# Patient Record
Sex: Male | Born: 1940
Health system: Southern US, Community
[De-identification: ages and names within clinical notes are randomized; demographics above are authoritative.]

## PROBLEM LIST (undated history)

## (undated) DIAGNOSIS — Q211 Atrial septal defect: Secondary | ICD-10-CM

## (undated) DIAGNOSIS — E7800 Pure hypercholesterolemia, unspecified: Secondary | ICD-10-CM

## (undated) DIAGNOSIS — N529 Male erectile dysfunction, unspecified: Secondary | ICD-10-CM

## (undated) DIAGNOSIS — F4321 Adjustment disorder with depressed mood: Secondary | ICD-10-CM

## (undated) DIAGNOSIS — R42 Dizziness and giddiness: Secondary | ICD-10-CM

## (undated) DIAGNOSIS — I420 Dilated cardiomyopathy: Secondary | ICD-10-CM

## (undated) DIAGNOSIS — F4320 Adjustment disorder, unspecified: Secondary | ICD-10-CM

## (undated) DIAGNOSIS — C4491 Basal cell carcinoma of skin, unspecified: Secondary | ICD-10-CM

## (undated) DIAGNOSIS — Q2112 Patent foramen ovale: Secondary | ICD-10-CM

## (undated) DIAGNOSIS — I2510 Atherosclerotic heart disease of native coronary artery without angina pectoris: Secondary | ICD-10-CM

## (undated) DIAGNOSIS — I219 Acute myocardial infarction, unspecified: Secondary | ICD-10-CM

## (undated) DIAGNOSIS — N401 Enlarged prostate with lower urinary tract symptoms: Secondary | ICD-10-CM

## (undated) DIAGNOSIS — I10 Essential (primary) hypertension: Secondary | ICD-10-CM

## (undated) DIAGNOSIS — I739 Peripheral vascular disease, unspecified: Secondary | ICD-10-CM

## (undated) DIAGNOSIS — R55 Syncope and collapse: Secondary | ICD-10-CM

## (undated) HISTORY — DX: Essential (primary) hypertension: I10

## (undated) HISTORY — DX: Atherosclerotic heart disease of native coronary artery without angina pectoris: I25.10

## (undated) HISTORY — DX: Pure hypercholesterolemia, unspecified: E78.00

## (undated) HISTORY — DX: Peripheral vascular disease, unspecified: I73.9

## (undated) HISTORY — DX: Acute myocardial infarction, unspecified: I21.9

## (undated) HISTORY — PX: NASAL SEPTUM SURGERY: SHX37

## (undated) HISTORY — DX: Basal cell carcinoma of skin, unspecified: C44.91

## (undated) HISTORY — PX: THROMBECTOMY: PRO61

## (undated) HISTORY — DX: Male erectile dysfunction, unspecified: N52.9

## (undated) HISTORY — DX: Dizziness and giddiness: R42

## (undated) HISTORY — DX: Benign prostatic hyperplasia with lower urinary tract symptoms: N40.1

## (undated) HISTORY — PX: ABDOMINAL AORTIC ANEURYSM REPAIR: SHX42

## (undated) HISTORY — DX: Syncope and collapse: R55

## (undated) HISTORY — DX: Adjustment disorder, unspecified: F43.20

## (undated) HISTORY — PX: TONSILLECTOMY: SUR1361

## (undated) HISTORY — DX: Adjustment disorder with depressed mood: F43.21

## (undated) HISTORY — PX: MULTIPLE TOOTH EXTRACTIONS: SHX2053

---
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
## 1999-02-08 ENCOUNTER — Ambulatory Visit: Admission: RE | Admit: 1999-02-08 | Discharge: 1999-02-08 | Payer: Self-pay | Admitting: Family Medicine

## 1999-02-14 ENCOUNTER — Encounter: Payer: Self-pay | Admitting: Cardiology

## 1999-02-14 ENCOUNTER — Ambulatory Visit (HOSPITAL_COMMUNITY): Admission: RE | Admit: 1999-02-14 | Discharge: 1999-02-14 | Payer: Self-pay | Admitting: Cardiology

## 1999-04-17 HISTORY — PX: KNEE ARTHROSCOPY: SUR90

## 1999-04-17 HISTORY — PX: CORONARY ARTERY BYPASS GRAFT: SHX141

## 1999-06-28 ENCOUNTER — Encounter: Payer: Self-pay | Admitting: Vascular Surgery

## 1999-06-30 ENCOUNTER — Inpatient Hospital Stay: Admission: RE | Admit: 1999-06-30 | Discharge: 1999-07-02 | Payer: Self-pay | Admitting: Vascular Surgery

## 1999-06-30 ENCOUNTER — Encounter: Payer: Self-pay | Admitting: Vascular Surgery

## 1999-10-30 ENCOUNTER — Ambulatory Visit: Admission: RE | Admit: 1999-10-30 | Discharge: 1999-10-30 | Payer: Self-pay | Admitting: Vascular Surgery

## 1999-11-03 ENCOUNTER — Encounter: Payer: Self-pay | Admitting: Vascular Surgery

## 1999-11-03 ENCOUNTER — Inpatient Hospital Stay (HOSPITAL_COMMUNITY): Admission: AD | Admit: 1999-11-03 | Discharge: 1999-11-19 | Payer: Self-pay | Admitting: Vascular Surgery

## 1999-11-04 ENCOUNTER — Encounter: Payer: Self-pay | Admitting: *Deleted

## 1999-11-04 ENCOUNTER — Encounter: Payer: Self-pay | Admitting: Vascular Surgery

## 1999-11-05 ENCOUNTER — Encounter: Payer: Self-pay | Admitting: *Deleted

## 1999-11-06 ENCOUNTER — Encounter: Payer: Self-pay | Admitting: Vascular Surgery

## 1999-11-07 ENCOUNTER — Encounter: Payer: Self-pay | Admitting: Vascular Surgery

## 1999-11-08 ENCOUNTER — Encounter: Payer: Self-pay | Admitting: Vascular Surgery

## 1999-11-09 ENCOUNTER — Encounter: Payer: Self-pay | Admitting: Vascular Surgery

## 1999-11-10 ENCOUNTER — Encounter: Payer: Self-pay | Admitting: Vascular Surgery

## 1999-11-13 ENCOUNTER — Encounter: Payer: Self-pay | Admitting: Thoracic Surgery (Cardiothoracic Vascular Surgery)

## 1999-11-14 ENCOUNTER — Encounter: Payer: Self-pay | Admitting: Thoracic Surgery (Cardiothoracic Vascular Surgery)

## 1999-11-15 ENCOUNTER — Encounter: Payer: Self-pay | Admitting: Thoracic Surgery (Cardiothoracic Vascular Surgery)

## 1999-11-16 ENCOUNTER — Encounter: Payer: Self-pay | Admitting: Thoracic Surgery (Cardiothoracic Vascular Surgery)

## 1999-11-21 ENCOUNTER — Ambulatory Visit (HOSPITAL_COMMUNITY): Admission: RE | Admit: 1999-11-21 | Discharge: 1999-11-21 | Payer: Self-pay | Admitting: *Deleted

## 2000-12-27 ENCOUNTER — Emergency Department (HOSPITAL_COMMUNITY): Admission: EM | Admit: 2000-12-27 | Discharge: 2000-12-27 | Payer: Self-pay | Admitting: Emergency Medicine

## 2002-10-29 ENCOUNTER — Emergency Department (HOSPITAL_COMMUNITY): Admission: EM | Admit: 2002-10-29 | Discharge: 2002-10-29 | Payer: Self-pay | Admitting: Emergency Medicine

## 2006-05-17 ENCOUNTER — Ambulatory Visit (HOSPITAL_COMMUNITY): Admission: RE | Admit: 2006-05-17 | Discharge: 2006-05-17 | Payer: Self-pay | Admitting: Otolaryngology

## 2010-04-16 HISTORY — PX: DUPUYTREN CONTRACTURE RELEASE: SHX1478

## 2010-07-07 ENCOUNTER — Encounter: Payer: Self-pay | Admitting: Cardiovascular Disease

## 2010-07-18 ENCOUNTER — Encounter: Payer: Self-pay | Admitting: *Deleted

## 2010-07-18 ENCOUNTER — Encounter: Payer: Self-pay | Admitting: Cardiovascular Disease

## 2010-07-19 ENCOUNTER — Encounter: Payer: Self-pay | Admitting: Cardiovascular Disease

## 2010-07-19 ENCOUNTER — Ambulatory Visit (INDEPENDENT_AMBULATORY_CARE_PROVIDER_SITE_OTHER): Payer: Medicare Other | Admitting: Cardiovascular Disease

## 2010-07-19 DIAGNOSIS — I2510 Atherosclerotic heart disease of native coronary artery without angina pectoris: Secondary | ICD-10-CM

## 2010-07-19 DIAGNOSIS — I714 Abdominal aortic aneurysm, without rupture: Secondary | ICD-10-CM

## 2010-07-19 DIAGNOSIS — I739 Peripheral vascular disease, unspecified: Secondary | ICD-10-CM

## 2010-07-19 DIAGNOSIS — Z01818 Encounter for other preprocedural examination: Secondary | ICD-10-CM

## 2010-07-19 NOTE — Progress Notes (Signed)
70 yo patient previously seen by Dr Amil Amen and [facility] cardiology.  Referred by Durwin Nora for preoperative clearance.  Extensive history of vascular disease.  AAA repair with LLE femoral bypass 2001 by Dr Hart [place].  Complicated  By M.I. And pulmonary edema.  Left brachial cath by Dr Ty Hilts showed LM and 3VD mildly decreased EF.  CAGB Dr Cornelius Moras LIMA LAD, SVG sequential IM and OM SVG PDA.  Has not had close F/U since then.  Stopped smoking at that time.  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  Low risk procedure.  Active around house and walks a lot.  No angina, claudication, dyspnea, palpitations edema or syncope.  Given lack of F/U and 70 yo grafts it is reasonable to do stress myovue to clear for surgery, assess exercise tolerance and claudication, and estimate EF and RWMA;s.    ROS: Denies fever, malais, weight loss, blurry vision, decreased visual acuity, cough, sputum, SOB, hemoptysis, pleuritic pain, palpitaitons, heartburn, abdominal pain, melena, lower extremity edema, claudication, or rash.   General: Affect appropriate Chronically ill appearing HEENT: normal Neck supple with no adenopathy JVP normal no bruits no thyromegaly Lungs clear with no wheezing and good diaphragmatic motion Heart:  S1/S2 no murmur,rub, gallop or click PMI normal Abdomen: benighn, BS positve, no tenderness, S/P  AAA repari Bilateral femoral  bruit.  No HSM or HJR Distal pulses intact  But diminished with LLE bypass No edema Neuro non-focal Skin warm and dry No muscular weakness Bilateral dupytrens contracture in hands  Medications No current outpatient prescriptions on file.    Allergies Review of patient's allergies indicates no known allergies.  Family History: Family History  Problem Relation Age of Onset  . Coronary artery disease    . Lung cancer      Social History: History   Social History  . Marital Status: Married    Spouse Name: N/A    Number of  Children: N/A  . Years of Education: N/A   Occupational History  . Not on file.   Social History Main Topics  . Smoking status: Former Games developer  . Smokeless tobacco: Not on file  . Alcohol Use: Not on file  . Drug Use: Not on file  . Sexually Active: Not on file   Other Topics Concern  . Not on file   Social History Narrative  . No narrative on file    Electrocardiogram: Will review from [facility] when available and will have ECG;s for myovue  Assessment and Plan

## 2010-07-19 NOTE — Patient Instructions (Addendum)
Your physician has requested that you have en exercise stress myoview. For further information please visit [email or web address]. Please follow instruction sheet, as given.  Your physician recommends that you schedule a follow-up appointment in: 6 months

## 2010-07-20 ENCOUNTER — Ambulatory Visit (HOSPITAL_COMMUNITY): Payer: Medicare Other | Attending: Cardiovascular Disease | Admitting: Radiology

## 2010-07-20 DIAGNOSIS — Z01818 Encounter for other preprocedural examination: Secondary | ICD-10-CM

## 2010-07-20 DIAGNOSIS — I739 Peripheral vascular disease, unspecified: Secondary | ICD-10-CM

## 2010-07-20 DIAGNOSIS — I472 Ventricular tachycardia: Secondary | ICD-10-CM

## 2010-07-20 DIAGNOSIS — Z01810 Encounter for preprocedural cardiovascular examination: Secondary | ICD-10-CM | POA: Insufficient documentation

## 2010-07-20 DIAGNOSIS — I2510 Atherosclerotic heart disease of native coronary artery without angina pectoris: Secondary | ICD-10-CM

## 2010-07-20 MED ORDER — TECHNETIUM TC 99M TETROFOSMIN IV KIT
11.0000 | PACK | Freq: Once | INTRAVENOUS | Status: AC | PRN
  Administered 2010-07-20: 11 via INTRAVENOUS

## 2010-07-20 MED ORDER — REGADENOSON 0.4 MG/5ML IV SOLN
0.4000 mg | Freq: Once | INTRAVENOUS | Status: AC
  Administered 2010-07-20: 0.4 mg via INTRAVENOUS

## 2010-07-20 MED ORDER — TECHNETIUM TC 99M TETROFOSMIN IV KIT
33.0000 | PACK | Freq: Once | INTRAVENOUS | Status: AC | PRN
  Administered 2010-07-20: 33 via INTRAVENOUS

## 2010-07-20 NOTE — Progress Notes (Signed)
[facility] SITE 3 NUCLEAR MED [address] [place] [address] [phone]  Cardiology Nuclear Med Frank Arias is a 70 y.o. male [id number] 1941/01/14   Nuclear Med Background Indication for Stress Test:  Evaluation for Ischemia, Graft Patency and Pending Hand Surgery by Dr. Dairl Ponder  History:  '01 Non Q MI>CABG/AAA repair with LLE fem-bypass Cardiac Risk Factors: Family History - CAD and History of Smoking  Symptoms:  No cardiac complaints   Nuclear Pre-Procedure Caffeine/Decaff Intake:  None NPO After: 8:00pm   Lungs:  Diminished, but clear.  O2 sat 95% on RA. IV 0.9% NS with Angio Cath:  20g  IV Site: R Forearm  IV Started by:  Stanton Kidney, EMT-P  Chest Size (in):  42 Cup Size: n/a  Height: 5\' 7"  (1.702 m)  Weight:  175 lb (79.379 kg)  BMI:  Body mass index is 27.41 kg/(m^2). Tech Comments:  NA    Nuclear Med Study 1 or 2 day study: 1 day  Stress Test Type:  Treadmill/Lexiscan  Reading MD: Charlton Haws, MD  Order Authorizing Provider:  Charlton Haws, MD  Resting Radionuclide: Technetium 9m Tetrofosmin  Resting Radionuclide Dose: 11 mCi   Stress Radionuclide:  Technetium 87m Tetrofosmin  Stress Radionuclide Dose: 33 mCi           Stress Protocol Rest HR: 67 Stress HR: 129  Rest BP: 156/97 Stress BP: 216/100  Exercise Time (min): 4:16 METS: n/a   Predicted Max HR: 150 bpm % Max HR: 86 bpm Rate Pressure Product: 29528   Dose of Adenosine (mg):  n/a Dose of Lexiscan: 0.4 mg  Dose of Atropine (mg): n/a Dose of Dobutamine: n/a mcg/kg/min (at max HR)  Stress Test Technologist: [employer], CMA-N  Nuclear Technologist:  Domenic Polite, CNMT     Rest Procedure:  Myocardial perfusion imaging was performed at rest 45 minutes following the intravenous administration of Technetium 66m Tetrofosmin. Rest ECG: Nonspecific T-wave changes, occasional PVC's and one 3-beat run of V-tach  Stress Procedure:  The patient initially walked  the treadmill utilizing the Bruce protocol, but was unable to obtain his heart rate due to claudication and a hypertensive response, 216/100.  He was then given IV Lexiscan 0.4 mg over 15-seconds with concurrent low level exercise and then Technetium 6m Tetrofosmin was injected at 30-seconds while the patient continued walking one more minute.  There were no diagnostic changes with Lexiscan.  There were frequent PVC's and occasional runs of nonsustained V-tach, with the longest being 5-beats.  Quantitative spect images were obtained after a 45-minute delay. Stress ECG: No significant change from baseline ECG  QPS Raw Data Images:  Normal; no motion artifact; normal heart/lung ratio. Stress Images:  There is decreased uptake in the inferior wall. Rest Images:  There is decreased uptake in the inferior wall. Subtraction (SDS):  There is a fixed inferior defect that is most consistent with diaphragmatic attenuation. Transient Ischemic Dilatation (Normal <1.22):  0.95 Lung/Heart Ratio (Normal <0.45):  0.30  Quantitative Gated Spect Images QGS EDV:  149 ml QGS ESV:  99 ml QGS cine images:  Decreased LV function with inferior wall motion QGS EF: 34%  Impression Exercise Capacity:  Lexiscan with low level exercise. BP Response:  Normal blood pressure response. Clinical Symptoms:  No chest pain. ECG Impression:  No significant ST segment change suggestive of ischemia. Comparison with Prior Nuclear Study: No images to compare  Overall Impression:  Moderate inferior wall infarct from apex  to base with no ischemia.  EF 34% Given low risk surgery and no ischemia the patient is cleared to have hand surgery but needs F/U for better medical Rx       Charlton Haws

## 2010-07-24 ENCOUNTER — Encounter (HOSPITAL_COMMUNITY): Payer: Self-pay | Admitting: Cardiovascular Disease

## 2010-07-25 ENCOUNTER — Telehealth: Payer: Self-pay | Admitting: Cardiovascular Disease

## 2010-07-25 NOTE — Telephone Encounter (Signed)
Spoke with pt, myoview results faxed to dr [employer]ne[employer] office. Pt aware. Frank Arias

## 2010-08-08 ENCOUNTER — Telehealth: Payer: Self-pay | Admitting: Cardiovascular Disease

## 2010-08-08 NOTE — Telephone Encounter (Signed)
All Cardiac faxed to [facility] @ [phone] 08/08/10/KM

## 2012-05-20 ENCOUNTER — Other Ambulatory Visit: Payer: Self-pay | Admitting: Family Medicine

## 2012-05-20 DIAGNOSIS — M25511 Pain in right shoulder: Secondary | ICD-10-CM

## 2012-05-21 ENCOUNTER — Ambulatory Visit
Admission: RE | Admit: 2012-05-21 | Discharge: 2012-05-21 | Disposition: A | Discharge status: Home / Self Care | Payer: Medicare Other | Source: Ambulatory Visit | Attending: Family Medicine | Admitting: Family Medicine

## 2012-05-21 DIAGNOSIS — M25511 Pain in right shoulder: Secondary | ICD-10-CM

## 2014-05-21 DIAGNOSIS — C44319 Basal cell carcinoma of skin of other parts of face: Secondary | ICD-10-CM | POA: Diagnosis not present

## 2014-05-21 DIAGNOSIS — D225 Melanocytic nevi of trunk: Secondary | ICD-10-CM | POA: Diagnosis not present

## 2014-05-21 DIAGNOSIS — C44310 Basal cell carcinoma of skin of unspecified parts of face: Secondary | ICD-10-CM | POA: Diagnosis not present

## 2014-05-21 DIAGNOSIS — C44229 Squamous cell carcinoma of skin of left ear and external auricular canal: Secondary | ICD-10-CM | POA: Diagnosis not present

## 2014-05-21 DIAGNOSIS — C44219 Basal cell carcinoma of skin of left ear and external auricular canal: Secondary | ICD-10-CM | POA: Diagnosis not present

## 2014-09-06 DIAGNOSIS — R42 Dizziness and giddiness: Secondary | ICD-10-CM | POA: Diagnosis not present

## 2014-09-06 DIAGNOSIS — N400 Enlarged prostate without lower urinary tract symptoms: Secondary | ICD-10-CM | POA: Diagnosis not present

## 2014-09-06 DIAGNOSIS — R350 Frequency of micturition: Secondary | ICD-10-CM | POA: Diagnosis not present

## 2014-09-06 DIAGNOSIS — J309 Allergic rhinitis, unspecified: Secondary | ICD-10-CM | POA: Diagnosis not present

## 2014-09-06 DIAGNOSIS — I2510 Atherosclerotic heart disease of native coronary artery without angina pectoris: Secondary | ICD-10-CM | POA: Diagnosis not present

## 2014-09-06 DIAGNOSIS — I739 Peripheral vascular disease, unspecified: Secondary | ICD-10-CM | POA: Diagnosis not present

## 2014-11-01 DIAGNOSIS — R351 Nocturia: Secondary | ICD-10-CM | POA: Diagnosis not present

## 2014-11-01 DIAGNOSIS — R350 Frequency of micturition: Secondary | ICD-10-CM | POA: Diagnosis not present

## 2015-02-07 DIAGNOSIS — Z961 Presence of intraocular lens: Secondary | ICD-10-CM | POA: Diagnosis not present

## 2015-02-07 DIAGNOSIS — H26493 Other secondary cataract, bilateral: Secondary | ICD-10-CM | POA: Diagnosis not present

## 2015-02-07 DIAGNOSIS — H43813 Vitreous degeneration, bilateral: Secondary | ICD-10-CM | POA: Diagnosis not present

## 2015-02-17 DIAGNOSIS — H26492 Other secondary cataract, left eye: Secondary | ICD-10-CM | POA: Diagnosis not present

## 2015-02-17 DIAGNOSIS — H2640 Unspecified secondary cataract: Secondary | ICD-10-CM | POA: Diagnosis not present

## 2015-03-17 DIAGNOSIS — H26491 Other secondary cataract, right eye: Secondary | ICD-10-CM | POA: Diagnosis not present

## 2015-03-17 DIAGNOSIS — H2640 Unspecified secondary cataract: Secondary | ICD-10-CM | POA: Diagnosis not present

## 2015-08-18 DIAGNOSIS — C44519 Basal cell carcinoma of skin of other part of trunk: Secondary | ICD-10-CM | POA: Diagnosis not present

## 2015-08-18 DIAGNOSIS — L821 Other seborrheic keratosis: Secondary | ICD-10-CM | POA: Diagnosis not present

## 2015-08-18 DIAGNOSIS — C44612 Basal cell carcinoma of skin of right upper limb, including shoulder: Secondary | ICD-10-CM | POA: Diagnosis not present

## 2015-08-18 DIAGNOSIS — C44319 Basal cell carcinoma of skin of other parts of face: Secondary | ICD-10-CM | POA: Diagnosis not present

## 2015-08-18 DIAGNOSIS — Z85828 Personal history of other malignant neoplasm of skin: Secondary | ICD-10-CM | POA: Diagnosis not present

## 2015-08-18 DIAGNOSIS — D485 Neoplasm of uncertain behavior of skin: Secondary | ICD-10-CM | POA: Diagnosis not present

## 2015-12-27 DIAGNOSIS — E782 Mixed hyperlipidemia: Secondary | ICD-10-CM | POA: Diagnosis not present

## 2015-12-27 DIAGNOSIS — N401 Enlarged prostate with lower urinary tract symptoms: Secondary | ICD-10-CM | POA: Diagnosis not present

## 2015-12-27 DIAGNOSIS — I2510 Atherosclerotic heart disease of native coronary artery without angina pectoris: Secondary | ICD-10-CM | POA: Diagnosis not present

## 2015-12-27 DIAGNOSIS — N529 Male erectile dysfunction, unspecified: Secondary | ICD-10-CM | POA: Diagnosis not present

## 2016-04-02 DIAGNOSIS — I2510 Atherosclerotic heart disease of native coronary artery without angina pectoris: Secondary | ICD-10-CM | POA: Diagnosis not present

## 2016-04-02 DIAGNOSIS — N529 Male erectile dysfunction, unspecified: Secondary | ICD-10-CM | POA: Diagnosis not present

## 2016-04-02 DIAGNOSIS — E782 Mixed hyperlipidemia: Secondary | ICD-10-CM | POA: Diagnosis not present

## 2016-04-02 DIAGNOSIS — N401 Enlarged prostate with lower urinary tract symptoms: Secondary | ICD-10-CM | POA: Diagnosis not present

## 2016-11-08 DIAGNOSIS — Z9889 Other specified postprocedural states: Secondary | ICD-10-CM | POA: Diagnosis not present

## 2016-11-08 DIAGNOSIS — I739 Peripheral vascular disease, unspecified: Secondary | ICD-10-CM | POA: Diagnosis not present

## 2016-11-08 DIAGNOSIS — N529 Male erectile dysfunction, unspecified: Secondary | ICD-10-CM | POA: Diagnosis not present

## 2016-11-08 DIAGNOSIS — I2510 Atherosclerotic heart disease of native coronary artery without angina pectoris: Secondary | ICD-10-CM | POA: Diagnosis not present

## 2016-11-08 DIAGNOSIS — Z951 Presence of aortocoronary bypass graft: Secondary | ICD-10-CM | POA: Diagnosis not present

## 2016-11-15 DIAGNOSIS — I739 Peripheral vascular disease, unspecified: Secondary | ICD-10-CM | POA: Diagnosis not present

## 2016-11-15 DIAGNOSIS — I10 Essential (primary) hypertension: Secondary | ICD-10-CM | POA: Diagnosis not present

## 2016-11-15 DIAGNOSIS — Z951 Presence of aortocoronary bypass graft: Secondary | ICD-10-CM | POA: Diagnosis not present

## 2016-11-15 DIAGNOSIS — I2510 Atherosclerotic heart disease of native coronary artery without angina pectoris: Secondary | ICD-10-CM | POA: Diagnosis not present

## 2016-11-16 DIAGNOSIS — I739 Peripheral vascular disease, unspecified: Secondary | ICD-10-CM | POA: Diagnosis not present

## 2016-11-16 DIAGNOSIS — I10 Essential (primary) hypertension: Secondary | ICD-10-CM | POA: Diagnosis not present

## 2016-11-16 DIAGNOSIS — I2510 Atherosclerotic heart disease of native coronary artery without angina pectoris: Secondary | ICD-10-CM | POA: Diagnosis not present

## 2016-11-21 DIAGNOSIS — I739 Peripheral vascular disease, unspecified: Secondary | ICD-10-CM | POA: Diagnosis not present

## 2016-11-21 DIAGNOSIS — Z951 Presence of aortocoronary bypass graft: Secondary | ICD-10-CM | POA: Diagnosis not present

## 2016-11-30 DIAGNOSIS — I739 Peripheral vascular disease, unspecified: Secondary | ICD-10-CM | POA: Diagnosis not present

## 2016-11-30 DIAGNOSIS — I2510 Atherosclerotic heart disease of native coronary artery without angina pectoris: Secondary | ICD-10-CM | POA: Diagnosis not present

## 2016-12-06 DIAGNOSIS — I10 Essential (primary) hypertension: Secondary | ICD-10-CM | POA: Diagnosis not present

## 2016-12-06 DIAGNOSIS — Z87891 Personal history of nicotine dependence: Secondary | ICD-10-CM | POA: Diagnosis not present

## 2016-12-07 ENCOUNTER — Encounter (HOSPITAL_COMMUNITY)
Admission: RE | Disposition: A | Discharge status: Home / Self Care | Payer: Self-pay | Source: Ambulatory Visit | Attending: Cardiology

## 2016-12-07 ENCOUNTER — Ambulatory Visit (HOSPITAL_COMMUNITY)
Admission: RE | Admit: 2016-12-07 | Discharge: 2016-12-07 | Disposition: A | Discharge status: Home / Self Care | Payer: Medicare HMO | Source: Ambulatory Visit | Attending: Cardiology | Admitting: Cardiology

## 2016-12-07 ENCOUNTER — Encounter (HOSPITAL_COMMUNITY): Payer: Self-pay

## 2016-12-07 DIAGNOSIS — Z87891 Personal history of nicotine dependence: Secondary | ICD-10-CM | POA: Insufficient documentation

## 2016-12-07 DIAGNOSIS — I10 Essential (primary) hypertension: Secondary | ICD-10-CM | POA: Insufficient documentation

## 2016-12-07 DIAGNOSIS — I252 Old myocardial infarction: Secondary | ICD-10-CM | POA: Insufficient documentation

## 2016-12-07 DIAGNOSIS — Z951 Presence of aortocoronary bypass graft: Secondary | ICD-10-CM | POA: Diagnosis not present

## 2016-12-07 DIAGNOSIS — I2510 Atherosclerotic heart disease of native coronary artery without angina pectoris: Secondary | ICD-10-CM | POA: Diagnosis not present

## 2016-12-07 DIAGNOSIS — I739 Peripheral vascular disease, unspecified: Secondary | ICD-10-CM | POA: Diagnosis present

## 2016-12-07 DIAGNOSIS — I70213 Atherosclerosis of native arteries of extremities with intermittent claudication, bilateral legs: Secondary | ICD-10-CM | POA: Diagnosis not present

## 2016-12-07 HISTORY — PX: LOWER EXTREMITY ANGIOGRAPHY: CATH118251

## 2016-12-07 SURGERY — LOWER EXTREMITY ANGIOGRAPHY
Anesthesia: LOCAL

## 2016-12-07 MED ORDER — SODIUM CHLORIDE 0.9 % IV SOLN
250.0000 mL | INTRAVENOUS | Status: DC | PRN
Start: 2016-12-07 — End: 2016-12-07

## 2016-12-07 MED ORDER — IODIXANOL 320 MG/ML IV SOLN
INTRAVENOUS | Status: DC | PRN
  Administered 2016-12-07: 158 mL via INTRA_ARTERIAL

## 2016-12-07 MED ORDER — HEPARIN (PORCINE) IN NACL 2-0.9 UNIT/ML-% IJ SOLN
INTRAMUSCULAR | Status: AC
  Filled 2016-12-07: qty 1000

## 2016-12-07 MED ORDER — HYDRALAZINE HCL 20 MG/ML IJ SOLN: 5.0000 mg | INTRAMUSCULAR | Status: DC | PRN

## 2016-12-07 MED ORDER — LIDOCAINE HCL (PF) 1 % IJ SOLN
INTRAMUSCULAR | Status: DC | PRN
  Administered 2016-12-07: 15 mL via INTRADERMAL

## 2016-12-07 MED ORDER — SODIUM CHLORIDE 0.9% FLUSH: 3.0000 mL | Freq: Two times a day (BID) | INTRAVENOUS | Status: DC

## 2016-12-07 MED ORDER — SODIUM CHLORIDE 0.9% FLUSH: 3.0000 mL | INTRAVENOUS | Status: DC | PRN

## 2016-12-07 MED ORDER — SODIUM CHLORIDE 0.9 % WEIGHT BASED INFUSION: 1.0000 mL/kg/h | INTRAVENOUS | Status: DC

## 2016-12-07 MED ORDER — MIDAZOLAM HCL 2 MG/2ML IJ SOLN
INTRAMUSCULAR | Status: DC | PRN
  Administered 2016-12-07: 2 mg via INTRAVENOUS

## 2016-12-07 MED ORDER — LIDOCAINE HCL (PF) 1 % IJ SOLN
INTRAMUSCULAR | Status: AC
  Filled 2016-12-07: qty 30

## 2016-12-07 MED ORDER — SODIUM CHLORIDE 0.9 % IV SOLN: 250.0000 mL | INTRAVENOUS | Status: DC | PRN

## 2016-12-07 MED ORDER — SODIUM CHLORIDE 0.9 % IV SOLN
INTRAVENOUS | Status: DC
  Administered 2016-12-07: 07:00:00 via INTRAVENOUS

## 2016-12-07 MED ORDER — LABETALOL HCL 5 MG/ML IV SOLN: 10.0000 mg | INTRAVENOUS | Status: DC | PRN

## 2016-12-07 MED ORDER — HYDROMORPHONE HCL 1 MG/ML IJ SOLN
INTRAMUSCULAR | Status: AC
  Filled 2016-12-07: qty 0.5

## 2016-12-07 MED ORDER — MIDAZOLAM HCL 2 MG/2ML IJ SOLN
INTRAMUSCULAR | Status: AC
  Filled 2016-12-07: qty 2

## 2016-12-07 MED ORDER — HEPARIN (PORCINE) IN NACL 2-0.9 UNIT/ML-% IJ SOLN
INTRAMUSCULAR | Status: AC | PRN
  Administered 2016-12-07: 1000 mL

## 2016-12-07 MED ORDER — HYDROMORPHONE HCL 1 MG/ML IJ SOLN
0.5000 mg | Freq: Once | INTRAMUSCULAR | Status: AC
  Administered 2016-12-07: 0.5 mg via INTRAVENOUS

## 2016-12-07 SURGICAL SUPPLY — 20 items
CATH NAVICROSS ST 65CM (CATHETERS) ×1 IMPLANT
CATH OMNI FLUSH 5F 65CM (CATHETERS) ×2 IMPLANT
CATH SOFT-VU 4F 65 STRAIGHT (CATHETERS) ×1 IMPLANT
CATH SOFT-VU STRAIGHT 4F 65CM (CATHETERS) ×1
CATHETER NAVICROSS ST 65CM (CATHETERS) ×2
COVER PRB 48X5XTLSCP FOLD TPE (BAG) ×1 IMPLANT
COVER PROBE 5X48 (BAG) ×1
GLIDEWIRE ADV .035X260CM (WIRE) ×2 IMPLANT
GUIDEWIRE ANGLED .035X150CM (WIRE) ×2 IMPLANT
KIT MICROINTRODUCER STIFF 5F (SHEATH) ×2 IMPLANT
KIT PV (KITS) ×2 IMPLANT
SHEATH PINNACLE 5F 10CM (SHEATH) ×2 IMPLANT
STOPCOCK MORSE 400PSI 3WAY (MISCELLANEOUS) ×2 IMPLANT
SYRINGE MEDRAD AVANTA MACH 7 (SYRINGE) ×2 IMPLANT
TRANSDUCER W/STOPCOCK (MISCELLANEOUS) ×2 IMPLANT
TRAY PV CATH (CUSTOM PROCEDURE TRAY) ×2 IMPLANT
TUBING CIL FLEX 10 FLL-RA (TUBING) ×2 IMPLANT
WIRE BENTSON .035X145CM (WIRE) ×2 IMPLANT
WIRE HI TORQ VERSACORE J 260CM (WIRE) ×2 IMPLANT
WIRE HITORQ VERSACORE ST 145CM (WIRE) IMPLANT

## 2016-12-07 NOTE — H&P (Signed)
OFFICE VISIT NOTES COPIED TO EPIC FOR DOCUMENTATION  . History of Present Illness Frank Arias; 11/17/2016 11:13 AM) Patient words: NP EVAL STAT for cad, claudication, pt states he was having an anuresym repair in his stomach and thats when he had a MI, had Bypass at the same time, had veins cleaned out in legs?.  The patient is a 76 year old male who presents with coronary artery disease.  Additional reasons for visit:  Claudication is described as the following: Patient is referred to [place] for evaluation for claudication. Patient was previously followed by Dr. Ilda Foil and [facility] cardiology. He underwent AAA repair with left lower extremity femoral bypass in 2001 by Dr. Kellie Simmering. During the procedure he had MI and pulmonary edema. He underwent left brachial catheter by Dr. Ilda Foil at that time that showed left main and three-vessel disease with mildly decreased LVEF. He then was set up for emergent CABG with Dr. Roxy Manns. He has not had follow-up since that time. He reports smoking cessation since that time. He underwent hand surgery in 2012 without difficulty. Prior to the procedure in 2012, he underwent nuclear stress testing that showed moderate inferior wall infarct from apex to base with no ischemia. LVEF at that time was estimated at 34%. He also reports having a right lower extremity arthrectomy several years ago in [facility].  Patient reports for the last 3-4 weeks he has had right leg pain with walking and states that he is only able to walk for 100 feet before he has to stop and rest for approximatley 30 minutes. He denies any chest pain or shortness of breath.  He is a former smoker that quit after his MI in 2001. Patient reports that he smoked for 45 years.   Problem List/Past Medical Frank Arias; 11-24-2016 1:10 PM) History of MI (myocardial infarction) (I25.2) [2000]:  Allergies Frank Arias; 11/24/2016 1:08 PM) No Known Drug Allergies  [Nov 24, 2016]:  Family History Frank Arias; 2016/11/24 1:09 PM) Mother  Deceased. around age 49, no heart issues Father  Deceased. at ge 72, no known heart issues Brother 5  3 passed, one had an MI  Social History Frank Arias; 11/24/2016 1:10 PM) Current tobacco use  Former smoker. quit in 2001 Alcohol Use  Occasional alcohol use. Beer couple times a week Marital status  Widowed. Living Situation  Lives alone. Number of Children  1.  Past Surgical History Frank Page, Arias; 11/17/2016 11:05 AM) Coronary Artery Bypass, Three [11/15/1999]: CABG 11/15/1999 Dr Roxy Manns LIMA LAD, SVG sequential RI and OM, SVG PDA. Aneurysm Repair [2001]: AAA repair with LLE femoral bypass 2001 by Dr Kellie Simmering.  Medication History Frank Arias; Nov 24, 2016 1:15 PM) No Current Medications  Diagnostic Studies History Frank Page, Arias; 11/17/2016 11:06 AM) Cardiovascular Stress Test  Lexiscan Myoview stress test 07/20/2010: LVEF 34% with inferior wall motion abnormality, moderate inferior infarct from apex to base with no ischemia.    Review of Systems Frank Arias; 11/17/2016 11:11 AM) General Not Present- Appetite Loss and Weight Gain. Respiratory Present- Difficulty Breathing on Exertion (stable). Not Present- Chronic Cough and Wakes up from Sleep Wheezing or Short of Breath. Cardiovascular Present- Claudications. Not Present- Chest Pain and Difficulty Breathing Lying Down. Gastrointestinal Not Present- Black, Tarry Stool and Difficulty Swallowing. Musculoskeletal Not Present- Decreased Range of Motion and Muscle Atrophy. Neurological Not Present- Attention Deficit. Psychiatric Present- Depression (still grieving his wife's death years ago). Not Present- Personality Changes and Suicidal Ideation. Endocrine Not Present- Cold Intolerance and  Heat Intolerance. Hematology Not Present- Abnormal Bleeding. All other systems negative  Vitals Frank Arias; 11/15/2016 1:21  PM) 11/15/2016 1:05 PM Weight: 167 lb Height: 66in Body Surface Area: 1.85 m Body Mass Index: 26.95 kg/m  Pulse: 72 (Regular)  P.OX: 97% (Room air) BP: 161/82 (Sitting, Left Arm, Standard)       Physical Exam Frank Arias; 11/17/2016 11:18 AM) General Mental Status-Alert. General Appearance-Cooperative and Appears stated age. Build & Nutrition-Moderately built.  Head and Neck Thyroid Gland Characteristics - normal size and consistency and no palpable nodules.  Chest and Lung Exam Chest and lung exam reveals -quiet, even and easy respiratory effort with no use of accessory muscles, non-tender and on auscultation, normal breath sounds, no adventitious sounds. Inspection Shape - Barrel-shaped. Auscultation Breath sounds - Bronchovesicular - Both Lung Fields.  Cardiovascular Cardiovascular examination reveals -normal heart sounds, regular rate and rhythm with no murmurs, carotid auscultation reveals no bruits and abdominal aorta auscultation reveals no bruits and no prominent pulsation.  Abdomen Inspection Incisional scars - Laparotomy scar(Abdominal aortic aneurysm repair with aorto-left femoral bypass). Palpation/Percussion Palpation and Percussion of the abdomen reveal - Non Tender and No hepatosplenomegaly.  Peripheral Vascular Lower Extremity Inspection - Bilateral - Pale and Shiny atrophic skin, No Ulcerations. Palpation - Femoral pulse - Bilateral - Normal. Popliteal pulse - Left - 2+. Right - 0+. Dorsalis pedis pulse - Bilateral - Absent. Posterior tibial pulse - Bilateral - Absent.  Neurologic Neurologic evaluation reveals -alert and oriented x 3 with no impairment of recent or remote memory. Motor-Grossly intact without any focal deficits.  Musculoskeletal Global Assessment Left Lower Extremity - no deformities, masses or tenderness, no known fractures. Right Lower Extremity - no deformities, masses or tenderness, no known  fractures.    Assessment & Plan Frank Arias; 11/17/2016 11:21 AM) Atherosclerosis of native coronary artery of native heart without angina pectoris (I25.10) Story: CABG 11/15/1999 Dr Roxy Manns LIMA LAD, SVG sequential IM and OM SVG PDA. Impression: EKG 11/15/2016: Normal sinus rhythm at rate of 70 bpm, normal axis, inferior T wave inversion, cannot rule out ischemia. Early or progression, cannot exclude posterior infarct old. PACs.   Lexiscan myoview stress test 11/16/2016 1. The resting electrocardiogram demonstrated normal sinus rhythm, normal resting conduction and no resting arrhythmias. Inferior infarct old, posterior infarct old. Stress EKG is non-diagnostic for ischemia as it a pharmacologic stress using Lexiscan. 2. Left ventricular cavity is noted to be enlarged on the rest and stress studies and measured 171 mL. There is a large area of infarction in the basal inferior, basal inferolateral, mid inferior, mid inferolateral, apical inferior and apical lateral myocardial wall(s). Overall left ventricular systolic function was abnormal with akinesis in the same region. The left ventricular ejection fraction was calculated or visually estimated to be 24%. This is an high risk study, clinical correlation recommended. Current Plans Started Atorvastatin Calcium 10MG, 1 (one) Tablet daily, #30, 11/15/2016, Ref. x2. Started Aspirin 81MG, 1 (one) Tablet daily, #360, 360 days starting 11/15/2016, Ref. x1, Mail Order #90, 90 days, Ref. x3. Complete electrocardiogram (93000) Future Plans 11/16/2016: Myocardial perfusion imaging, tomographic (SPECT) (including attenuation correction, qualitative or quantitative wall motion, ejection fraction by first pass or gated technique, additional quantification, when performed) - one time History of coronary artery bypass graft (Z95.1) Story: CABG 11/15/1999 Dr Roxy Manns LIMA LAD, SVG sequential IM and OM SVG PDA. PAD (peripheral artery disease) (I73.9) Story:  AAA repair with LLE femoral bypass 2001 by Dr Kellie Simmering. Current Plans Started Clopidogrel  Bisulfate 75MG, 1 (one) Tablet daily with food, #30, 11/17/2016, Ref. x3. Future Plans 12/06/2016: Complete duplex ultrasound of arteries of lower extremity (27517) - one time Claudication (I73.9) Story: Right calf worse than left. Right leg atherectomy in past. Hypertension, benign (I10) Current Plans Started Ramipril 5MG, 1 (one) Capsule daily, #30, 11/15/2016, Ref. x1. Future Plans 12/06/2016: Echocardiography, transthoracic, real-time with image documentation (2D), includes M-mode recording, when performed, complete, with spectral Doppler echocardiography, and with color flow Doppler echocardiography (00174) - one time Laboratory examination (Z01.89) Story: 03/31/2016: Cholesterol 139, triglycerides 90, HDL 43, LDL 76. CBC normal. Creatinine 1.07, potassium 4.0, EGFR 67, CMP normal. TSH 2.6. Former tobacco use [phone]) Future Plans 12/06/2016: Duplex scan of aorta, inferior vena cava, iliac vasculature, or bypass grafts; complete study (59163) - one time Note:Extremely complex patient who had periprocedural myocardial infarction I'll having aortic aneurysm repair with aorta to left femoral bypass surgery in June 27, 1999. Since his wife passed away 5-6 years ago which he still grieving, he is not seeking any medical attention. Now he has developed severe symptoms of claudication to point of not able to do routine activities hence presents for follow-up. Clearly has abnormal physical exam and needs vascular workup including angiography. Extensive discussion to make lifestyle changes, fortunately has quit smoking and is remained abstinent, but advised him he needs to be on aggressive medical therapy. Started him on aspirin 81 mg daily, atorvastatin 10 mg daily along with ramipril 5 mg daily for hypertension.   We will set him up for a Lexiscan Myoview stress test, echocardiogram, lower extremity arterial duplex  along with abdominal aortic ultrasound for evaluation of aneurysm. His labs were reviewed, although his lipids are controlled due to extensive vascular history and CAD, needs to be on a statin. Unless the stress test is abnormal or high risk, previously has had inferior wall scar with reduced EF at that 30-35%, I will set him up for lower extremity angiography depending on the findings of the noninvasive workup. He is requesting that we proceed with angiography at the earliest as his symptoms of claudication are very severe in his right leg. He is aware of the risks associated with angiography including contrast nephropathy, bleeding, infection, need for surgical urgent intervention. His records of vascular surgery and CABG not available.  This was a greater than 60 minute office visit with greater than 50% of the time spent with face-to-face encounter with patient and evaluation of complex medical issues, review of external records and coordination of care.  CC: Dr. Antony Contras. Addendum Note(Jagadeesh Carlynn Herald Arias; 11/25/2016 4:34 PM) Lower extremity arterial duplex 11/20/2016 Right proximal SFA is occluded, there is diffuse heteregenous plaque. Right ABI could not be obtained. Non-compressible ABI on the right, suggestive of medial calcinosis. This exam reveals mildly decreased perfusion of the left lower extremity, noted at the post tibial artery level. There is diffuseplaque throughout the left lower extremity with biphasic waveforms. Left ABI 0.96.  Addendum Note(Jagadeesh Carlynn Herald Arias; 12/02/2016 7:55 AM) Labs 11/30/2016: Serum glucose 88, BUN 16, creatinine 0.90, eGFR 83/96, potassium 4.4, HB 13.3/HCT 36.6, platelets 195, normal indicis. Pro time normal.     Signed by Frank Page, Arias (11/17/2016 11:21 AM)

## 2016-12-07 NOTE — Interval H&P Note (Signed)
History and Physical Interval Note:  12/07/2016 7:49 AM  Frank Arias  has presented today for surgery, with the diagnosis of Leg pain, claudication  The various methods of treatment have been discussed with the patient and family. After consideration of risks, benefits and other options for treatment, the patient has consented to  Procedure(s): Lower Extremity Angiography (N/A) and possible intervention as a surgical intervention .  The patient's history has been reviewed, patient examined, no change in status, stable for surgery.  I have reviewed the patient's chart and labs.  Questions were answered to the patient's satisfaction.   Patient was Rx statin and ARB but has not started.  Adrian Prows

## 2016-12-07 NOTE — Discharge Instructions (Signed)
Angiogram, Care After °This sheet gives you information about how to care for yourself after your procedure. Your health care provider may also give you more specific instructions. If you have problems or questions, contact your health care provider. °What can I expect after the procedure? °After the procedure, it is common to have bruising and tenderness at the catheter insertion area. °Follow these instructions at home: °Insertion site care  °· Follow instructions from your health care provider about how to take care of your insertion site. Make sure you: °¨ Wash your hands with soap and water before you change your bandage (dressing). If soap and water are not available, use hand sanitizer. °¨ Change your dressing as told by your health care provider. °¨ Leave stitches (sutures), skin glue, or adhesive strips in place. These skin closures may need to stay in place for 2 weeks or longer. If adhesive strip edges start to loosen and curl up, you may trim the loose edges. Do not remove adhesive strips completely unless your health care provider tells you to do that. °· Do not take baths, swim, or use a hot tub until your health care provider approves. °· You may shower 24-48 hours after the procedure or as told by your health care provider. °¨ Gently wash the site with plain soap and water. °¨ Pat the area dry with a clean towel. °¨ Do not rub the site. This may cause bleeding. °· Do not apply powder or lotion to the site. Keep the site clean and dry. °· Check your insertion site every day for signs of infection. Check for: °¨ Redness, swelling, or pain. °¨ Fluid or blood. °¨ Warmth. °¨ Pus or a bad smell. °Activity  °· Rest as told by your health care provider, usually for 1-2 days. °· Do not lift anything that is heavier than 10 lbs. (4.5 kg) or as told by your health care provider. °· Do not drive for 24 hours if you were given a medicine to help you relax (sedative). °· Do not drive or use heavy machinery while  taking prescription pain medicine. °General instructions  °· Return to your normal activities as told by your health care provider, usually in about a week. Ask your health care provider what activities are safe for you. °· If the catheter site starts bleeding, lie flat and put pressure on the site. If the bleeding does not stop, get help right away. This is a medical emergency. °· Drink enough fluid to keep your urine clear or pale yellow. This helps flush the contrast dye from your body. °· Take over-the-counter and prescription medicines only as told by your health care provider. °· Keep all follow-up visits as told by your health care provider. This is important. °Contact a health care provider if: °· You have a fever or chills. °· You have redness, swelling, or pain around your insertion site. °· You have fluid or blood coming from your insertion site. °· The insertion site feels warm to the touch. °· You have pus or a bad smell coming from your insertion site. °· You have bruising around the insertion site. °· You notice blood collecting in the tissue around the catheter site (hematoma). The hematoma may be painful to the touch. °Get help right away if: °· You have severe pain at the catheter insertion area. °· The catheter insertion area swells very fast. °· The catheter insertion area is bleeding, and the bleeding does not stop when you hold steady pressure on   the area. °· The area near or just beyond the catheter insertion site becomes pale, cool, tingly, or numb. °These symptoms may represent a serious problem that is an emergency. Do not wait to see if the symptoms will go away. Get medical help right away. Call your local emergency services (911 in the [place] Do not drive yourself to the hospital. °Summary °· After the procedure, it is common to have bruising and tenderness at the catheter insertion area. °· After the procedure, it is important to rest and drink plenty of fluids. °· Do not take baths,  swim, or use a hot tub until your health care provider says it is okay to do so. You may shower 24-48 hours after the procedure or as told by your health care provider. °· If the catheter site starts bleeding, lie flat and put pressure on the site. If the bleeding does not stop, get help right away. This is a medical emergency. °This information is not intended to replace advice given to you by your health care provider. Make sure you discuss any questions you have with your health care provider. °Document Released: 10/19/2004 Document Revised: 03/07/2016 Document Reviewed: 03/07/2016 °Elsevier Interactive Patient Education © 2017 [employer] ° °

## 2016-12-12 ENCOUNTER — Encounter: Payer: Self-pay | Admitting: Vascular Surgery

## 2016-12-20 ENCOUNTER — Encounter: Payer: Self-pay | Admitting: Vascular Surgery

## 2016-12-20 ENCOUNTER — Ambulatory Visit (INDEPENDENT_AMBULATORY_CARE_PROVIDER_SITE_OTHER): Payer: Medicare HMO | Admitting: Vascular Surgery

## 2016-12-20 ENCOUNTER — Other Ambulatory Visit: Payer: Self-pay

## 2016-12-20 VITALS — BP 141/87 | HR 68 | Temp 97.3°F | Ht 67.0 in | Wt 168.0 lb

## 2016-12-20 DIAGNOSIS — I739 Peripheral vascular disease, unspecified: Secondary | ICD-10-CM | POA: Diagnosis not present

## 2016-12-20 NOTE — Progress Notes (Signed)
Referring Physician: Clayton Lefort MD  Patient name: Frank Arias MRN: [id number] DOB: Jan 25, 1941 Sex: male  REASON FOR CONSULT: Short distance claudication with rest pain right foot  HPI: Frank Arias is a 76 y.o. male who previously underwent aortobifemoral bypass graft by Dr. Kellie Simmering many years ago for abdominal aortic aneurysm. For the last 6 weeks his walking distance is decreased to about one block before extrinsic claudication symptoms in his right leg. He also has some occasional numbness and tingling in his right foot which is relieved by dangling the foot or moving around. He has no symptoms in the left leg. He has no existing wounds in the right or left leg. He has greater saphenous vein has previously been harvested from the right leg for coronary grafting. The left leg vein has been harvested for prior left femoropopliteal bypass. The patient recently had an abdominal aortogram lower extremity runoff by Dr. Nadyne Coombes. This showed patent aortobifemoral bypass graft. There is also a left femoral to below-knee popliteal bypass graft with two-vessel runoff to the left foot. In the right leg there was a distal common femoral occlusion with reconstitution of the more distal profunda collaterals were given off in the mid leg the right superficial femoral and popliteal arteries are occluded. There was two-vessel runoff via posterior tibial and peroneal arteries. These were reconstituted by collaterals from the mid thigh. The patient is on statin and aspirin. He is not smoking. Other medical problems include elevated cholesterol coronary artery disease both of which are stable.  Past Medical History:  Diagnosis Date  . CAD (coronary artery disease)   . Erectile dysfunction   . Hypercholesterolemia   . Myocardial infarction ([place])   . Peripheral vascular disease [facility])    Past Surgical History:  Procedure Laterality Date  . CORONARY ARTERY BYPASS GRAFT  2001    Median sternotomy for coronary  artery bypass grafting x 4 (left internal mammary artery to distal left anterior descending coronary artery, saphenous vein graft to ramus intermediate branch, sequential saphenous vein graft to second circumflex marginal branch, saphenous vein graft to posterior descending coronary artery).  . LOWER EXTREMITY ANGIOGRAPHY N/A 12/07/2016   Procedure: Lower Extremity Angiography;  Surgeon: Adrian Prows, MD;  Location: [place] [facility];  Service: Cardiovascular;  Laterality: N/A;  . NASAL SEPTUM SURGERY    . THROMBECTOMY      Family History  Problem Relation Age of Onset  . Coronary artery disease Unknown   . Lung cancer Unknown     SOCIAL HISTORY: Social History   Social History  . Marital status: Widowed    Spouse name: N/A  . Number of children: N/A  . Years of education: N/A   Occupational History  . Not on file.   Social History Main Topics  . Smoking status: Former Smoker    Types: Cigarettes    Quit date: 04/16/1998  . Smokeless tobacco: Never Used  . Alcohol use Yes     Comment: occasional  . Drug use: No  . Sexual activity: Not on file   Other Topics Concern  . Not on file   Social History Narrative  . No narrative on file    No Known Allergies  Current Outpatient Prescriptions  Medication Sig Dispense Refill  . aspirin EC 81 MG tablet Take 81 mg by mouth daily.    Marland Kitchen atorvastatin (LIPITOR) 10 MG tablet Take 10 mg by mouth daily.    . ramipril (ALTACE) 5 MG capsule Take  5 mg by mouth daily.     No current facility-administered medications for this visit.     ROS:   General:  No weight loss, Fever, chills  HEENT: No recent headaches, no nasal bleeding, no visual changes, no sore throat  Neurologic: No dizziness, blackouts, seizures. No recent symptoms of stroke or mini- stroke. No recent episodes of slurred speech, or temporary blindness.  Cardiac: No recent episodes of chest pain/pressure, no shortness of breath at rest.  No shortness of breath with  exertion.  Denies history of atrial fibrillation or irregular heartbeat  Vascular: + history of rest pain in feet.  + history of claudication.  No history of non-healing ulcer, No history of DVT   Pulmonary: No home oxygen, no productive cough, no hemoptysis,  No asthma or wheezing  Musculoskeletal:  [ ]  Arthritis, [ ]  Low back pain,  [ ]  Joint pain  Hematologic:No history of hypercoagulable state.  No history of easy bleeding.  No history of anemia  Gastrointestinal: No hematochezia or melena,  No gastroesophageal reflux, no trouble swallowing  Urinary: [ ]  chronic Kidney disease, [ ]  on HD - [ ]  MWF or [ ]  TTHS, [ ]  Burning with urination, [ ]  Frequent urination, [ ]  Difficulty urinating;   Skin: No rashes  Psychological: No history of anxiety,  No history of depression   Physical Examination  Vitals:   12/20/16 0816  BP: (!) 141/87  Pulse: 68  Temp: (!) 97.3 F (36.3 C)  TempSrc: Oral  SpO2: 98%  Weight: 168 lb (76.2 kg)  Height: 5\' 7"  (1.702 m)    Body mass index is 26.31 kg/m.  General:  Alert and oriented, no acute distress HEENT: Normal Neck: No bruit or JVD Pulmonary: Clear to auscultation bilaterally Cardiac: Regular Rate and Rhythm without murmur Abdomen: Soft, non-tender, non-distended, no mass, no scars Skin: No rash Extremity Pulses:  2+ radial, brachial, femoral,Absent right and left dorsalis pedis, posterior tibial pulses bilaterally Musculoskeletal: No deformity or edema  Neurologic: Upper and lower extremity motor 5/5 and symmetric  DATA:  I reviewed the patient's ABIs from Dr. Mila Palmer office from a few weeks ago. Left ABI was 0.8 right was 0 also reviewed the angiographic images which shows widely patent aortobifemoral bypass left femoral below-knee popliteal bypass two-vessel runoff occlusion distal right common femoral artery and proximal profunda with reconstitution of 2 tibial vessels distally  ASSESSMENT:  She has short distance claudication  and rest pain in the right leg. He does not have adequate conduit for a vein bypass to the tibial vessels. I believe using prosthetic for a tibial bypass would be of limited duration. With the best option for him currently via right common femoral endarterectomy with profundoplasty which should give him adequate flow to return him to his state 6 weeks ago. I discussed the patient today that I thought tibial bypass would not be indicated currently but that if he did not improve with femoral endarterectomy we could consider that down the road possibly using arm vein.   PLAN:  Right femoral endarterectomy with profundoplasty scheduled for 12/31/2016. Risks benefits possible, patient and procedure details were discussed the patient today including but limited to bleeding infection limb loss possible graft infection of the aortobifemoral bypass graft myocardial events. He understands and agrees to proceed.   Ruta Hinds, MD [facility] Office: [phone] Pager: [phone]

## 2016-12-25 NOTE — Pre-Procedure Instructions (Signed)
Frank Arias  12/25/2016      [id number] [place], [place] - [address] [place] [address] [place] [address] Phone: [phone] Fax: [phone]    Your procedure is scheduled on September 17  Report to [place] at [place].M.  Call this number if you have problems the morning of surgery:  [phone]   Remember:  Do not eat food or drink liquids after midnight.  Continue all other medications as directed by your physician except follow these medication instructions before surgery   Take these medicines the morning of surgery with A SIP OF WATER NONE  7 days prior to surgery STOP taking any Aspirin, Aleve, Naproxen, Ibuprofen, Motrin, Advil, Goody's, BC's, all herbal medications, fish oil, and all vitamins  Follow your doctors instructions regarding your Aspirin.  If no instructions were given by the doctor you will need to call the office to get instructions.  Your pre admission RN will also call for those instructions    Do not wear jewelry  Do not wear lotions, powders, or cologne, or deoderant.  Men may shave face and neck.  Do not bring valuables to the hospital.  [facility] is not responsible for any belongings or valuables.  Contacts, dentures or bridgework may not be worn into surgery.  Leave your suitcase in the car.  After surgery it may be brought to your room.  For patients admitted to the hospital, discharge time will be determined by your treatment team.  Patients discharged the day of surgery will not be allowed to drive home.    Special instructions:   [place]- Preparing For Surgery  Before surgery, you can play an important role. Because skin is not sterile, your skin needs to be as free of germs as possible. You can reduce the number of germs on your skin by washing with CHG (chlorahexidine gluconate) Soap before surgery.  CHG is an antiseptic cleaner which kills germs and  bonds with the skin to continue killing germs even after washing.  Please do not use if you have an allergy to CHG or antibacterial soaps. If your skin becomes reddened/irritated stop using the CHG.  Do not shave (including legs and underarms) for at least 48 hours prior to first CHG shower. It is OK to shave your face.  Please follow these instructions carefully.   1. Shower the NIGHT BEFORE SURGERY and the MORNING OF SURGERY with CHG.   2. If you chose to wash your hair, wash your hair first as usual with your normal shampoo.  3. After you shampoo, rinse your hair and body thoroughly to remove the shampoo.  4. Use CHG as you would any other liquid soap. You can apply CHG directly to the skin and wash gently with a scrungie or a clean washcloth.   5. Apply the CHG Soap to your body ONLY FROM THE NECK DOWN.  Do not use on open wounds or open sores. Avoid contact with your eyes, ears, mouth and genitals (private parts). Wash genitals (private parts) with your normal soap.  6. Wash thoroughly, paying special attention to the area where your surgery will be performed.  7. Thoroughly rinse your body with warm water from the neck down.  8. DO NOT shower/wash with your normal soap after using and rinsing off the CHG Soap.  9. Pat yourself dry with a CLEAN TOWEL.   10. Wear CLEAN PAJAMAS  11. Place CLEAN SHEETS on your bed the night of your first shower and DO NOT SLEEP WITH PETS.    Day of Surgery: Do not apply any deodorants/lotions. Please wear clean clothes to the hospital/surgery center.      Please read over the following fact sheets that you were given.

## 2016-12-26 ENCOUNTER — Other Ambulatory Visit: Payer: Self-pay

## 2016-12-26 ENCOUNTER — Encounter (HOSPITAL_COMMUNITY): Payer: Self-pay

## 2016-12-26 ENCOUNTER — Encounter (HOSPITAL_COMMUNITY)
Admission: RE | Admit: 2016-12-26 | Discharge: 2016-12-26 | Disposition: A | Discharge status: Home / Self Care | Payer: Medicare HMO | Source: Ambulatory Visit | Attending: Vascular Surgery | Admitting: Vascular Surgery

## 2016-12-26 DIAGNOSIS — I739 Peripheral vascular disease, unspecified: Secondary | ICD-10-CM | POA: Insufficient documentation

## 2016-12-26 DIAGNOSIS — I70221 Atherosclerosis of native arteries of extremities with rest pain, right leg: Secondary | ICD-10-CM | POA: Diagnosis not present

## 2016-12-26 DIAGNOSIS — E7800 Pure hypercholesterolemia, unspecified: Secondary | ICD-10-CM | POA: Diagnosis present

## 2016-12-26 DIAGNOSIS — Q211 Atrial septal defect: Secondary | ICD-10-CM | POA: Diagnosis not present

## 2016-12-26 DIAGNOSIS — Z951 Presence of aortocoronary bypass graft: Secondary | ICD-10-CM | POA: Diagnosis not present

## 2016-12-26 DIAGNOSIS — M79604 Pain in right leg: Secondary | ICD-10-CM | POA: Diagnosis present

## 2016-12-26 DIAGNOSIS — I743 Embolism and thrombosis of arteries of the lower extremities: Secondary | ICD-10-CM | POA: Diagnosis not present

## 2016-12-26 DIAGNOSIS — I2510 Atherosclerotic heart disease of native coronary artery without angina pectoris: Secondary | ICD-10-CM | POA: Diagnosis present

## 2016-12-26 DIAGNOSIS — I70211 Atherosclerosis of native arteries of extremities with intermittent claudication, right leg: Secondary | ICD-10-CM | POA: Diagnosis present

## 2016-12-26 DIAGNOSIS — I252 Old myocardial infarction: Secondary | ICD-10-CM | POA: Diagnosis not present

## 2016-12-26 DIAGNOSIS — Z87891 Personal history of nicotine dependence: Secondary | ICD-10-CM | POA: Diagnosis not present

## 2016-12-26 DIAGNOSIS — D62 Acute posthemorrhagic anemia: Secondary | ICD-10-CM | POA: Diagnosis not present

## 2016-12-26 DIAGNOSIS — Z7982 Long term (current) use of aspirin: Secondary | ICD-10-CM | POA: Diagnosis not present

## 2016-12-26 HISTORY — DX: Atrial septal defect: Q21.1

## 2016-12-26 HISTORY — DX: Patent foramen ovale: Q21.12

## 2016-12-26 LAB — PROTIME-INR
INR: 1.06
PROTHROMBIN TIME: 13.7 s (ref 11.4–15.2)

## 2016-12-26 LAB — COMPREHENSIVE METABOLIC PANEL
ALBUMIN: 3.7 g/dL (ref 3.5–5.0)
ALT: 20 U/L (ref 17–63)
AST: 24 U/L (ref 15–41)
Alkaline Phosphatase: 96 U/L (ref 38–126)
Anion gap: 6 (ref 5–15)
BILIRUBIN TOTAL: 0.3 mg/dL (ref 0.3–1.2)
BUN: 14 mg/dL (ref 6–20)
CHLORIDE: 106 mmol/L (ref 101–111)
CO2: 24 mmol/L (ref 22–32)
CREATININE: 0.97 mg/dL (ref 0.61–1.24)
Calcium: 9.2 mg/dL (ref 8.9–10.3)
GFR calc Af Amer: >60 mL/min (ref >60)
GLUCOSE: 108 mg/dL — AB (ref 65–99)
Potassium: 4 mmol/L (ref 3.5–5.1)
Sodium: 136 mmol/L (ref 135–145)
Total Protein: 7.6 g/dL (ref 6.5–8.1)

## 2016-12-26 LAB — SURGICAL PCR SCREEN
MRSA, PCR: NEGATIVE
Staphylococcus aureus: NEGATIVE

## 2016-12-26 LAB — CBC
HEMATOCRIT: 41.8 % (ref 39.0–52.0)
Hemoglobin: 13.5 g/dL (ref 13.0–17.0)
MCH: 28.3 pg (ref 26.0–34.0)
MCHC: 32.3 g/dL (ref 30.0–36.0)
MCV: 87.6 fL (ref 78.0–100.0)
PLATELETS: 151 10*3/uL (ref 150–400)
RBC: 4.77 MIL/uL (ref 4.22–5.81)
RDW: 13.4 % (ref 11.5–15.5)
WBC: 5.6 10*3/uL (ref 4.0–10.5)

## 2016-12-26 LAB — URINALYSIS, ROUTINE W REFLEX MICROSCOPIC
BILIRUBIN URINE: NEGATIVE
Bacteria, UA: NONE SEEN
GLUCOSE, UA: NEGATIVE mg/dL
KETONES UR: NEGATIVE mg/dL
Leukocytes, UA: NEGATIVE
NITRITE: NEGATIVE
PROTEIN: NEGATIVE mg/dL
SQUAMOUS EPITHELIAL / LPF: NONE SEEN
Specific Gravity, Urine: 1.016 (ref 1.005–1.030)
pH: 5 (ref 5.0–8.0)

## 2016-12-26 LAB — TYPE AND SCREEN
ABO/RH(D): O POS
Antibody Screen: NEGATIVE

## 2016-12-26 LAB — APTT: APTT: 37 s — AB (ref 24–36)

## 2016-12-26 LAB — ABO/RH: ABO/RH(D): O POS

## 2016-12-26 NOTE — Progress Notes (Addendum)
PCP - Shirline Frees Cardiologist - [place]  Chest x-ray -  EKG -  Stress Test - 2012 ECHO - requesting Cardiac Cath - 2018 PVC  Patient is a poor historian of his medical history.  Sending to anesthesia of records requested from Cardiologist  Per dr. Oneida Alar patient is to continue aspirin  Patient denies shortness of breath, fever, cough and chest pain at PAT appointment   Patient verbalized understanding of instructions that were given to them at the PAT appointment. Patient was also instructed that they will need to review over the PAT instructions again at home before surgery.

## 2016-12-27 ENCOUNTER — Encounter (HOSPITAL_COMMUNITY): Payer: Self-pay

## 2016-12-27 NOTE — Progress Notes (Signed)
Anesthesia Chart Review: Patient is a 76 year old male scheduled for right femoral endarterectomy profundoplasty on 12/31/16 by Dr. Ruta Hinds.  History includes former smoker (quit '00), CAD/MI s/p CABG (LIMA-LAD, SVG-IM-OM, SVG-PDA) '01, hypercholesterolemia, PVD/AAA s/p AFBG and left FPBG '01 (complicated by perioperative MI), ED, nasal septum surgery, tonsillectomy.  Patient is referred to [place] for evaluation for claudication. Patient was previously followed by Dr. Ilda Foil and [facility] cardiology. He underwent AAA repair with left lower extremity femoral bypass in 2001 by Dr. Kellie Simmering. During the procedure he had MI and pulmonary edema. He underwent left brachial catheter by Dr. Ilda Foil at that time that showed left main and three-vessel disease with mildly decreased LVEF. He then was set up for emergent CABG with Dr. Roxy Manns. He has not had follow-up since that time. He reports smoking cessation since that time. He underwent hand surgery in 2012 without difficulty. Prior to the procedure in 2012, he underwent nuclear stress testing that showed moderate inferior wall infarct from apex to base with no ischemia. LVEF at that time was estimated at 34%. He also reports having a right lower extremity arthrectomy several years ago in [facility].  Cardiologist is Dr. Adrian Prows with [facility]. Last office visit 11/15/16 with referral to [facility] for vascular surgery following 12/07/16 arterigram.   Meds include ASA 81 mg, Lipitor, ramipril. He is to continue ASA.   EKG 12/26/16: SR with occasional PVCs. Inferior infarct (age undetermined).  Echo 12/06/16 [facility]): Conclusions: 1. Left ventricle cavity is normal in size. Left ventricle regional wall motion findings: Basal inferior hypokinesis. Mild inferior, apical lateral, apical inferior and apical cap akinesis. LV apical thrombus cannot be excluded. Calculated EF 31%.  2. Left atrial cavity is moderately dilated. Aneurysmal  intra-atrial septum with PFO present. 3. Moderate (grade II) aortic regurgitation. 4. Moderate (grade III) mitral regurgitation. 5. Mild tricuspid regurgitation. Peak pulmonary artery systolic pressure is estimated at 25-30 mmHg.  Nuclear stress test 11/16/16: Impression: 1. The resting electrocardiogram demonstrated normal sinus rhythm, normal resting conduction and no resting arrhythmias. Inferior infarct old, posterior infarct old. Stress EKG is nondiagnostic for ischemia as it is a pharmacologic stress using Lexiscan. 2. Left ventricular cavity is noted to be enlarged on the rest and stress studies measured 171 mL. There is a large area of infarction in the basal inferior, basal inferolateral, mid inferior, mid inferolateral, apical inferior and apical lateral myocardial walls. Overall left ventricular systolic function was abnormal with akinesis in the same region. The left ventricular ejection fraction was calculated or visually estimated to be 24%. This is a high risk study, clinical correlation recommended. (Previous EF 34% on 07/20/10 nuclear stress test.)   Peripheral arteriogram 12/07/16 (Dr. Einar Gip):  - Aortobifemoral graft widely patent.  - Left femoral to popliteal below-knee bypass is widely patent. Below the left knee there is one-vessel runoff I the form of posterior tibial. Peroneal and anterior tibial are occluded and reconstitutes at the level of the ankle. - Right SFA is occluded with severe long segment calcification through the entire right SFA. Right profunda gives extensive collaterals and is a high-grade 80% stenosis in the proximal segment. Below the right knee, the tibioperoneal trunk is occluded up to the distal segment and the distal vessels, PT and peroneal are patent. PT again reoccludes short segment but reconstitutes immediately with extensive collaterals. Anterior tibial is occluded. RECOMMENDATIONS: Patient has no percutaneous endovascular access/revascularization options.  He is high risk for limb loss. Due to severe symptoms of claudication,  high-grade stenosis in the profunda which is maintaining his right leg wire broke, I have referred him to be evaluated by Dr. Ruta Hinds for possible consideration of right femoral endarterectomy and patch angioplasty.  Abdominal aorta U/S 12/06/16 [facility]): Conclusions: The maximum aortic diameter is 2.5 cm (dist). Diffuse plaque observed in the distal aorta. Normal flow velocities noted in the aorta approximately vessels. No AAA observed.  Preoperative labs noted. Cr 0.97. CBC WNL. INR 1.06. PTT 37. Glucose 108.   I spoke with Dr. Einar Gip today to discuss patient's recent testing and confirmed that no additional cardiology testing/intervention planned at this time. Stress test felt high risk due to large area of infarct and depressed EF. Previous EF also < 35% in 2012. No plans for PFO intervention as it was small and patient without CVA history. LV apical thrombus could not be excluded on echo, but he felt that even if there was thrombus then would be old/adherent from prior MI. No plans for TEE. Patient is continuing ASA.   Cardiology is on board with surgery plans. If no acute changes then I would anticipate that he can proceed as planned.  George Hugh [facility] Stay [facility]/Anesthesiology Phone [phone] 12/27/2016 4:05 PM

## 2016-12-30 ENCOUNTER — Encounter (HOSPITAL_COMMUNITY): Payer: Self-pay | Admitting: Anesthesiology

## 2016-12-30 NOTE — Anesthesia Preprocedure Evaluation (Addendum)
Anesthesia Evaluation  Patient identified by MRN, date of birth, ID band Patient awake    Airway Mallampati: I       Dental  (+) Edentulous Upper, Edentulous Lower   Pulmonary former smoker,    Pulmonary exam normal        Cardiovascular hypertension, Pt. on medications Normal cardiovascular exam Rhythm:Regular Rate:Normal     Neuro/Psych    GI/Hepatic negative GI ROS, Neg liver ROS,   Endo/Other  negative endocrine ROS  Renal/GU negative Renal ROS  negative genitourinary   Musculoskeletal negative musculoskeletal ROS (+)   Abdominal Normal abdominal exam  (+)   Peds  Hematology negative hematology ROS (+)   Anesthesia Other Findings   Reproductive/Obstetrics                           Anesthesia Physical Anesthesia Plan  ASA: III  Anesthesia Plan: General   Post-op Pain Management:    Induction: Intravenous  PONV Risk Score and Plan:   Airway Management Planned: Oral ETT  Additional Equipment: Arterial line  Intra-op Plan:   Post-operative Plan: Extubation in OR  Informed Consent: I have reviewed the patients History and Physical, chart, labs and discussed the procedure including the risks, benefits and alternatives for the proposed anesthesia with the patient or authorized representative who has indicated his/her understanding and acceptance.   Dental advisory given  Plan Discussed with: CRNA and Surgeon  Anesthesia Plan Comments:        Anesthesia Quick Evaluation

## 2016-12-31 ENCOUNTER — Inpatient Hospital Stay (HOSPITAL_COMMUNITY): Payer: Medicare HMO | Admitting: Anesthesiology

## 2016-12-31 ENCOUNTER — Inpatient Hospital Stay (HOSPITAL_COMMUNITY)
Admission: RE | Admit: 2016-12-31 | Discharge: 2017-01-01 | DRG: 253 | Disposition: A | Discharge status: Home / Self Care | Payer: Medicare HMO | Source: Ambulatory Visit | Attending: Vascular Surgery | Admitting: Vascular Surgery

## 2016-12-31 ENCOUNTER — Inpatient Hospital Stay (HOSPITAL_COMMUNITY): Payer: Medicare HMO | Admitting: Vascular Surgery

## 2016-12-31 ENCOUNTER — Encounter (HOSPITAL_COMMUNITY)
Admission: RE | Disposition: A | Discharge status: Home / Self Care | Payer: Self-pay | Source: Ambulatory Visit | Attending: Vascular Surgery

## 2016-12-31 DIAGNOSIS — E7800 Pure hypercholesterolemia, unspecified: Secondary | ICD-10-CM | POA: Diagnosis present

## 2016-12-31 DIAGNOSIS — Q211 Atrial septal defect: Secondary | ICD-10-CM | POA: Diagnosis not present

## 2016-12-31 DIAGNOSIS — I739 Peripheral vascular disease, unspecified: Secondary | ICD-10-CM | POA: Diagnosis present

## 2016-12-31 DIAGNOSIS — Z87891 Personal history of nicotine dependence: Secondary | ICD-10-CM | POA: Diagnosis not present

## 2016-12-31 DIAGNOSIS — Z7982 Long term (current) use of aspirin: Secondary | ICD-10-CM | POA: Diagnosis not present

## 2016-12-31 DIAGNOSIS — I2510 Atherosclerotic heart disease of native coronary artery without angina pectoris: Secondary | ICD-10-CM | POA: Diagnosis present

## 2016-12-31 DIAGNOSIS — Z951 Presence of aortocoronary bypass graft: Secondary | ICD-10-CM | POA: Diagnosis not present

## 2016-12-31 DIAGNOSIS — I70211 Atherosclerosis of native arteries of extremities with intermittent claudication, right leg: Secondary | ICD-10-CM | POA: Diagnosis present

## 2016-12-31 DIAGNOSIS — I70221 Atherosclerosis of native arteries of extremities with rest pain, right leg: Secondary | ICD-10-CM | POA: Diagnosis not present

## 2016-12-31 DIAGNOSIS — D62 Acute posthemorrhagic anemia: Secondary | ICD-10-CM | POA: Diagnosis not present

## 2016-12-31 DIAGNOSIS — M79604 Pain in right leg: Secondary | ICD-10-CM | POA: Diagnosis present

## 2016-12-31 DIAGNOSIS — I252 Old myocardial infarction: Secondary | ICD-10-CM | POA: Diagnosis not present

## 2016-12-31 HISTORY — PX: ENDARTERECTOMY FEMORAL: SHX5804

## 2016-12-31 HISTORY — PX: PATCH ANGIOPLASTY: SHX6230

## 2016-12-31 LAB — CBC
HCT: 34.9 % — ABNORMAL LOW (ref 39.0–52.0)
HEMOGLOBIN: 11.2 g/dL — AB (ref 13.0–17.0)
MCH: 27.8 pg (ref 26.0–34.0)
MCHC: 32.1 g/dL (ref 30.0–36.0)
MCV: 86.6 fL (ref 78.0–100.0)
Platelets: 144 10*3/uL — ABNORMAL LOW (ref 150–400)
RBC: 4.03 MIL/uL — AB (ref 4.22–5.81)
RDW: 13.3 % (ref 11.5–15.5)
WBC: 5.6 10*3/uL (ref 4.0–10.5)

## 2016-12-31 LAB — CREATININE, SERUM
Creatinine, Ser: 0.95 mg/dL (ref 0.61–1.24)
GFR calc Af Amer: >60 mL/min (ref >60)
GFR calc non Af Amer: >60 mL/min (ref >60)

## 2016-12-31 SURGERY — ENDARTERECTOMY, FEMORAL
Anesthesia: General | Site: Groin | Laterality: Right

## 2016-12-31 MED ORDER — SODIUM CHLORIDE 0.9 % IV SOLN
INTRAVENOUS | Status: DC | PRN
  Administered 2016-12-31: 09:00:00

## 2016-12-31 MED ORDER — DEXAMETHASONE SODIUM PHOSPHATE 10 MG/ML IJ SOLN
INTRAMUSCULAR | Status: AC
  Filled 2016-12-31: qty 1

## 2016-12-31 MED ORDER — EPHEDRINE 5 MG/ML INJ
INTRAVENOUS | Status: AC
  Filled 2016-12-31: qty 10

## 2016-12-31 MED ORDER — HEPARIN SODIUM (PORCINE) 1000 UNIT/ML IJ SOLN
INTRAMUSCULAR | Status: DC | PRN
  Administered 2016-12-31: 8000 [IU] via INTRAVENOUS
  Administered 2016-12-31: 4000 [IU] via INTRAVENOUS

## 2016-12-31 MED ORDER — ROCURONIUM BROMIDE 10 MG/ML (PF) SYRINGE
PREFILLED_SYRINGE | INTRAVENOUS | Status: AC
  Filled 2016-12-31: qty 5

## 2016-12-31 MED ORDER — ROCURONIUM BROMIDE 100 MG/10ML IV SOLN
INTRAVENOUS | Status: DC | PRN
  Administered 2016-12-31: 10 mg via INTRAVENOUS
  Administered 2016-12-31: 60 mg via INTRAVENOUS
  Administered 2016-12-31 (×3): 10 mg via INTRAVENOUS

## 2016-12-31 MED ORDER — SUGAMMADEX SODIUM 200 MG/2ML IV SOLN
INTRAVENOUS | Status: DC | PRN
  Administered 2016-12-31: 120 mg via INTRAVENOUS

## 2016-12-31 MED ORDER — PROPOFOL 10 MG/ML IV BOLUS
INTRAVENOUS | Status: AC
  Filled 2016-12-31: qty 20

## 2016-12-31 MED ORDER — ENOXAPARIN SODIUM 30 MG/0.3ML ~~LOC~~ SOLN: 30.0000 mg | SUBCUTANEOUS | Status: DC

## 2016-12-31 MED ORDER — DEXTROSE 5 % IV SOLN
1.5000 g | INTRAVENOUS | Status: AC
  Administered 2016-12-31: 1.5 g via INTRAVENOUS

## 2016-12-31 MED ORDER — MIDAZOLAM HCL 2 MG/2ML IJ SOLN
INTRAMUSCULAR | Status: AC
  Filled 2016-12-31: qty 2

## 2016-12-31 MED ORDER — ONDANSETRON HCL 4 MG/2ML IJ SOLN
INTRAMUSCULAR | Status: AC
  Filled 2016-12-31: qty 2

## 2016-12-31 MED ORDER — MEPERIDINE HCL 25 MG/ML IJ SOLN: 6.2500 mg | INTRAMUSCULAR | Status: DC | PRN

## 2016-12-31 MED ORDER — POLYETHYLENE GLYCOL 3350 17 G PO PACK: 17.0000 g | PACK | Freq: Every day | ORAL | Status: DC | PRN

## 2016-12-31 MED ORDER — PROPOFOL 10 MG/ML IV BOLUS
INTRAVENOUS | Status: DC | PRN
  Administered 2016-12-31: 10 mg via INTRAVENOUS
  Administered 2016-12-31: 50 mg via INTRAVENOUS
  Administered 2016-12-31 (×2): 10 mg via INTRAVENOUS

## 2016-12-31 MED ORDER — INFLUENZA VAC SPLIT HIGH-DOSE 0.5 ML IM SUSY
0.5000 mL | PREFILLED_SYRINGE | INTRAMUSCULAR | Status: DC
  Filled 2016-12-31: qty 0.5

## 2016-12-31 MED ORDER — 0.9 % SODIUM CHLORIDE (POUR BTL) OPTIME
TOPICAL | Status: DC | PRN
  Administered 2016-12-31: 2000 mL

## 2016-12-31 MED ORDER — LIDOCAINE HCL (CARDIAC) 20 MG/ML IV SOLN
INTRAVENOUS | Status: DC | PRN
  Administered 2016-12-31: 60 mg via INTRAVENOUS

## 2016-12-31 MED ORDER — LIDOCAINE 2% (20 MG/ML) 5 ML SYRINGE
INTRAMUSCULAR | Status: AC
  Filled 2016-12-31: qty 10

## 2016-12-31 MED ORDER — HEMOSTATIC AGENTS (NO CHARGE) OPTIME
TOPICAL | Status: DC | PRN
  Administered 2016-12-31: 1 via TOPICAL

## 2016-12-31 MED ORDER — GUAIFENESIN-DM 100-10 MG/5ML PO SYRP: 15.0000 mL | ORAL_SOLUTION | ORAL | Status: DC | PRN

## 2016-12-31 MED ORDER — HYDRALAZINE HCL 20 MG/ML IJ SOLN: 5.0000 mg | INTRAMUSCULAR | Status: DC | PRN

## 2016-12-31 MED ORDER — DEXTROSE 5 % IV SOLN
INTRAVENOUS | Status: AC
  Filled 2016-12-31: qty 1.5

## 2016-12-31 MED ORDER — PHENYLEPHRINE HCL 10 MG/ML IJ SOLN
INTRAVENOUS | Status: DC | PRN
  Administered 2016-12-31: 40 ug/min via INTRAVENOUS

## 2016-12-31 MED ORDER — PROMETHAZINE HCL 25 MG/ML IJ SOLN: 6.2500 mg | INTRAMUSCULAR | Status: DC | PRN

## 2016-12-31 MED ORDER — ASPIRIN EC 81 MG PO TBEC
81.0000 mg | DELAYED_RELEASE_TABLET | Freq: Every day | ORAL | Status: DC
  Administered 2017-01-01: 81 mg via ORAL
  Filled 2016-12-31: qty 1

## 2016-12-31 MED ORDER — ONDANSETRON HCL 4 MG/2ML IJ SOLN
INTRAMUSCULAR | Status: DC | PRN
  Administered 2016-12-31: 4 mg via INTRAVENOUS

## 2016-12-31 MED ORDER — LACTATED RINGERS IV SOLN
INTRAVENOUS | Status: DC | PRN
  Administered 2016-12-31: 07:00:00 via INTRAVENOUS

## 2016-12-31 MED ORDER — DEXTROSE 5 % IV SOLN
1.5000 g | Freq: Two times a day (BID) | INTRAVENOUS | Status: AC
  Administered 2016-12-31 – 2017-01-01 (×2): 1.5 g via INTRAVENOUS
  Filled 2016-12-31 (×2): qty 1.5

## 2016-12-31 MED ORDER — ALUM & MAG HYDROXIDE-SIMETH 200-200-20 MG/5ML PO SUSP: 15.0000 mL | ORAL | Status: DC | PRN

## 2016-12-31 MED ORDER — PROTAMINE SULFATE 10 MG/ML IV SOLN
INTRAVENOUS | Status: AC
  Filled 2016-12-31: qty 25

## 2016-12-31 MED ORDER — ONDANSETRON HCL 4 MG/2ML IJ SOLN: 4.0000 mg | Freq: Four times a day (QID) | INTRAMUSCULAR | Status: DC | PRN

## 2016-12-31 MED ORDER — FENTANYL CITRATE (PF) 250 MCG/5ML IJ SOLN
INTRAMUSCULAR | Status: AC
  Filled 2016-12-31: qty 5

## 2016-12-31 MED ORDER — OXYCODONE-ACETAMINOPHEN 5-325 MG PO TABS
1.0000 | ORAL_TABLET | ORAL | Status: DC | PRN
  Administered 2016-12-31 – 2017-01-01 (×2): 1 via ORAL
  Filled 2016-12-31 (×2): qty 1

## 2016-12-31 MED ORDER — PHENYLEPHRINE 40 MCG/ML (10ML) SYRINGE FOR IV PUSH (FOR BLOOD PRESSURE SUPPORT)
PREFILLED_SYRINGE | INTRAVENOUS | Status: AC
  Filled 2016-12-31: qty 10

## 2016-12-31 MED ORDER — SUGAMMADEX SODIUM 200 MG/2ML IV SOLN
INTRAVENOUS | Status: AC
  Filled 2016-12-31: qty 2

## 2016-12-31 MED ORDER — SODIUM CHLORIDE 0.9 % IV SOLN: INTRAVENOUS | Status: DC

## 2016-12-31 MED ORDER — SUCCINYLCHOLINE CHLORIDE 200 MG/10ML IV SOSY
PREFILLED_SYRINGE | INTRAVENOUS | Status: AC
  Filled 2016-12-31: qty 10

## 2016-12-31 MED ORDER — HEPARIN SODIUM (PORCINE) 1000 UNIT/ML IJ SOLN
INTRAMUSCULAR | Status: AC
  Filled 2016-12-31: qty 1

## 2016-12-31 MED ORDER — ACETAMINOPHEN 325 MG PO TABS: 325.0000 mg | ORAL_TABLET | ORAL | Status: DC | PRN

## 2016-12-31 MED ORDER — MIDAZOLAM HCL 5 MG/5ML IJ SOLN
INTRAMUSCULAR | Status: DC | PRN
  Administered 2016-12-31: 1 mg via INTRAVENOUS

## 2016-12-31 MED ORDER — MORPHINE SULFATE (PF) 2 MG/ML IV SOLN: 2.0000 mg | INTRAVENOUS | Status: DC | PRN

## 2016-12-31 MED ORDER — MAGNESIUM SULFATE 2 GM/50ML IV SOLN
2.0000 g | Freq: Every day | INTRAVENOUS | Status: DC | PRN
  Filled 2016-12-31: qty 50

## 2016-12-31 MED ORDER — POTASSIUM CHLORIDE CRYS ER 20 MEQ PO TBCR: 20.0000 meq | EXTENDED_RELEASE_TABLET | Freq: Every day | ORAL | Status: DC | PRN

## 2016-12-31 MED ORDER — SODIUM CHLORIDE 0.9 % IV SOLN
INTRAVENOUS | Status: DC
  Administered 2016-12-31: 15:00:00 via INTRAVENOUS

## 2016-12-31 MED ORDER — CHLORHEXIDINE GLUCONATE 4 % EX LIQD: 60.0000 mL | Freq: Once | CUTANEOUS | Status: DC

## 2016-12-31 MED ORDER — ALBUMIN HUMAN 5 % IV SOLN
INTRAVENOUS | Status: DC | PRN
  Administered 2016-12-31: 11:00:00 via INTRAVENOUS

## 2016-12-31 MED ORDER — DOCUSATE SODIUM 100 MG PO CAPS: 100.0000 mg | ORAL_CAPSULE | Freq: Every day | ORAL | Status: DC

## 2016-12-31 MED ORDER — FENTANYL CITRATE (PF) 100 MCG/2ML IJ SOLN
INTRAMUSCULAR | Status: DC | PRN
  Administered 2016-12-31: 50 ug via INTRAVENOUS
  Administered 2016-12-31 (×4): 25 ug via INTRAVENOUS
  Administered 2016-12-31: 75 ug via INTRAVENOUS
  Administered 2016-12-31: 25 ug via INTRAVENOUS

## 2016-12-31 MED ORDER — KETOROLAC TROMETHAMINE 30 MG/ML IJ SOLN: 30.0000 mg | Freq: Once | INTRAMUSCULAR | Status: DC | PRN

## 2016-12-31 MED ORDER — ACETAMINOPHEN 650 MG RE SUPP: 325.0000 mg | RECTAL | Status: DC | PRN

## 2016-12-31 MED ORDER — PANTOPRAZOLE SODIUM 40 MG PO TBEC
40.0000 mg | DELAYED_RELEASE_TABLET | Freq: Every day | ORAL | Status: DC
  Filled 2016-12-31: qty 1

## 2016-12-31 MED ORDER — PNEUMOCOCCAL VAC POLYVALENT 25 MCG/0.5ML IJ INJ: 0.5000 mL | INJECTION | INTRAMUSCULAR | Status: DC

## 2016-12-31 MED ORDER — METOPROLOL TARTRATE 5 MG/5ML IV SOLN
2.0000 mg | INTRAVENOUS | Status: DC | PRN
Start: 2016-12-31 — End: 2017-01-01

## 2016-12-31 MED ORDER — HYDROMORPHONE HCL 1 MG/ML IJ SOLN: 0.2500 mg | INTRAMUSCULAR | Status: DC | PRN

## 2016-12-31 MED ORDER — LABETALOL HCL 5 MG/ML IV SOLN: 10.0000 mg | INTRAVENOUS | Status: DC | PRN

## 2016-12-31 MED ORDER — PHENOL 1.4 % MT LIQD: 1.0000 | OROMUCOSAL | Status: DC | PRN

## 2016-12-31 MED ORDER — SODIUM CHLORIDE 0.9 % IV SOLN: 500.0000 mL | Freq: Once | INTRAVENOUS | Status: DC | PRN

## 2016-12-31 MED ORDER — LIDOCAINE HCL 4 % EX SOLN
CUTANEOUS | Status: DC | PRN
  Administered 2016-12-31: 2 mL via TOPICAL

## 2016-12-31 MED ORDER — DEXAMETHASONE SODIUM PHOSPHATE 10 MG/ML IJ SOLN
INTRAMUSCULAR | Status: DC | PRN
  Administered 2016-12-31: 10 mg via INTRAVENOUS

## 2016-12-31 MED ORDER — RAMIPRIL 5 MG PO CAPS
5.0000 mg | ORAL_CAPSULE | Freq: Every day | ORAL | Status: DC
  Administered 2017-01-01: 5 mg via ORAL
  Filled 2016-12-31: qty 1

## 2016-12-31 MED ORDER — BISACODYL 10 MG RE SUPP: 10.0000 mg | Freq: Every day | RECTAL | Status: DC | PRN

## 2016-12-31 MED ORDER — ATORVASTATIN CALCIUM 10 MG PO TABS
10.0000 mg | ORAL_TABLET | Freq: Every day | ORAL | Status: DC
  Administered 2016-12-31: 10 mg via ORAL

## 2016-12-31 SURGICAL SUPPLY — 45 items
CANISTER SUCT 3000ML PPV (MISCELLANEOUS) ×2 IMPLANT
CANNULA VESSEL 3MM 2 BLNT TIP (CANNULA) ×4 IMPLANT
CATH EMB 4FR 80CM (CATHETERS) ×2 IMPLANT
CATH EMB 5FR 80CM (CATHETERS) ×2 IMPLANT
CLIP VESOCCLUDE MED 24/CT (CLIP) ×2 IMPLANT
CLIP VESOCCLUDE SM WIDE 24/CT (CLIP) ×2 IMPLANT
DERMABOND ADVANCED (GAUZE/BANDAGES/DRESSINGS) ×1
DERMABOND ADVANCED .7 DNX12 (GAUZE/BANDAGES/DRESSINGS) ×1 IMPLANT
DRAIN SNY WOU (WOUND CARE) IMPLANT
ELECT REM PT RETURN 9FT ADLT (ELECTROSURGICAL) ×2
ELECTRODE REM PT RTRN 9FT ADLT (ELECTROSURGICAL) ×1 IMPLANT
EVACUATOR SILICONE 100CC (DRAIN) IMPLANT
GLOVE BIO SURGEON STRL SZ 6.5 (GLOVE) ×6 IMPLANT
GLOVE BIO SURGEON STRL SZ7.5 (GLOVE) ×2 IMPLANT
GLOVE BIOGEL PI IND STRL 6 (GLOVE) ×1 IMPLANT
GLOVE BIOGEL PI IND STRL 6.5 (GLOVE) ×1 IMPLANT
GLOVE BIOGEL PI INDICATOR 6 (GLOVE) ×1
GLOVE BIOGEL PI INDICATOR 6.5 (GLOVE) ×1
GLOVE SURG SS PI 7.0 STRL IVOR (GLOVE) ×2 IMPLANT
GOWN STRL REUS W/ TWL LRG LVL3 (GOWN DISPOSABLE) ×3 IMPLANT
GOWN STRL REUS W/ TWL XL LVL3 (GOWN DISPOSABLE) ×2 IMPLANT
GOWN STRL REUS W/TWL LRG LVL3 (GOWN DISPOSABLE) ×3
GOWN STRL REUS W/TWL XL LVL3 (GOWN DISPOSABLE) ×2
HEMOSTAT SPONGE AVITENE ULTRA (HEMOSTASIS) ×2 IMPLANT
KIT BASIN OR (CUSTOM PROCEDURE TRAY) ×2 IMPLANT
KIT ROOM TURNOVER OR (KITS) ×2 IMPLANT
LOOP VESSEL MINI RED (MISCELLANEOUS) ×2 IMPLANT
NS IRRIG 1000ML POUR BTL (IV SOLUTION) ×4 IMPLANT
PACK PERIPHERAL VASCULAR (CUSTOM PROCEDURE TRAY) ×2 IMPLANT
PAD ARMBOARD 7.5X6 YLW CONV (MISCELLANEOUS) ×4 IMPLANT
PATCH HEMASHIELD 8X75 (Vascular Products) ×2 IMPLANT
STOPCOCK 4 WAY LG BORE MALE ST (IV SETS) ×2 IMPLANT
SUT ETHILON 3 0 PS 1 (SUTURE) IMPLANT
SUT PROLENE 5 0 C 1 24 (SUTURE) ×4 IMPLANT
SUT PROLENE 6 0 CC (SUTURE) ×2 IMPLANT
SUT PROLENE 7 0 BV1 MDA (SUTURE) ×2 IMPLANT
SUT VIC AB 2-0 SH 27 (SUTURE) ×1
SUT VIC AB 2-0 SH 27XBRD (SUTURE) ×1 IMPLANT
SUT VIC AB 3-0 SH 27 (SUTURE) ×2
SUT VIC AB 3-0 SH 27X BRD (SUTURE) ×2 IMPLANT
SUT VIC AB 4-0 PS2 27 (SUTURE) ×2 IMPLANT
SYR 3ML LL SCALE MARK (SYRINGE) ×2 IMPLANT
TRAY FOLEY W/METER SILVER 16FR (SET/KITS/TRAYS/PACK) ×2 IMPLANT
UNDERPAD 30X30 (UNDERPADS AND DIAPERS) ×2 IMPLANT
WATER STERILE IRR 1000ML POUR (IV SOLUTION) ×2 IMPLANT

## 2016-12-31 NOTE — Anesthesia Postprocedure Evaluation (Signed)
Anesthesia Post Note  Patient: Frank Arias  Procedure(s) Performed: Procedure(s) (LRB): RIGHT FEMORAL ENDARTERECTOMY (Right) PATCH PROFUNDOPLASTY, Right Femoral Profunda using Hemashield vascular patch. (Right)     Patient location during evaluation: PACU Anesthesia Type: General Level of consciousness: awake Pain management: pain level controlled Vital Signs Assessment: post-procedure vital signs reviewed and stable Respiratory status: spontaneous breathing Cardiovascular status: stable Postop Assessment: no apparent nausea or vomiting Anesthetic complications: no    Last Vitals:  Vitals:   12/31/16 1233 12/31/16 1248  BP: 119/67 132/69  Pulse: 69 73  Resp: 13 15  Temp:    SpO2: 96% 97%    Last Pain:  Vitals:   12/31/16 0612  TempSrc: Oral   Pain Goal:                 Keidra Withers JR,JOHN Caraline Deutschman

## 2016-12-31 NOTE — Op Note (Addendum)
Procedure: Right common femoral endarterectomy with profundaplasty  Preoperative diagnosis: Rest pain right foot  Postoperative diagnosis: same  Anesthesia: Gen.  Assistant: Leontine Locket PA-C  Operative findings: #1 Severe calcific atherosclerotic plaque right common femoral artery  #2 Dacron patch left common femoral artery extending 2 cm onto the profunda past the first branch point  Operative details: After obtaining informed consent, the patient was taken to operating room. The patient was placed in supine position on the operating table. After induction of general anesthesia and endotracheal intubation, the patient's entire right lower extremity was prepped and draped in usual sterile fashion. A longitudinal incision was made in the right groin care down through the subcutaneous tissues down level right common femoral artery. There was a moderate amount of scar tissue from a prior aortobifem.  The right limb of the graft was indentified and dissected out on its anterior surface down onto the native common femoral artery.  This was severely calcified. Dissection was carried down to level the femoral bifurcation. The superficial femoral artery was dissected free circumferentially as well as the profunda femoris artery. These were also both heavily calcified.  Both these had vessel loops placed around them. Dissection was then carried out the level of the inguinal ligament. The common femoral artery was still heavily calcified in this area. The graft and the native common femoral were essentially fused together so I dissected them free circumferentially as a unit.  To reach a suitable endpoint of normal artery I had to ligate several crossing profunda vein branches.  The profunda divided into two large branches.  I was able to go to the bifurcation of the more lateral branch and place loops around both of these as well as a more medial branch.  The right limb of the aorto bifem had a good quality  pulse.  At this point patient was given 8,000 units of intravenous heparin. He was given an additional 4000 units during the case.  The limb of the graft was controlled with a peripheral Debakey clamp.  The SFA and profunda branches with fine bulldog clamp.  A longitudinal opening was made in the hood of the distal dacron limb. There is a large amount of calcific plaque nearly occluding the common femoral artery and extending deep into the SFA and profunda.  There was a trickle of blood from the native external iliac which was controlled with 5 Fogarty balloon.  I  found a suitable plane to begin an endarterectomy in the common femoral endarterectomy was carried proximally up into the distal external iliac artery. A plug of plaque was removed creating a flush proximal endpoint. I then carried the endarterectomy distally down to the femoral bifurcation. A good endpoint was obtained in the profunda as the secondary branch points were beginning. An eversion endarterectomy was performed in the superficial femoral artery and a good endpoint was also obtained.  There was only a trickle of flow from this as it was occluded distally. There was good backbleeding from the profunda. There was excellent arterial inflow. A Dacron patch was then brought up in the operative field and sewn on as a patch angioplasty after removing all loose debris from the artery. Just prior completion anastomosis everything was forebled backbled and thoroughly flushed. Anastomosis was secured clamps released there was pulsatile flow in the SFA and profunda immediately. The patient had dorsalis pedis Doppler flow which was monophasic and a biphasic PT doppler. Hemostasis was obtained with Avitene and direct pressure. The groin was then  closed in multiple layers of running 2 0 3-0 Vicryl suture followed by 4 0 Vicryl subcuticular stitch in the skin and Dermabond on the skin. Patient tolerated the procedure well and there were no complications.  The  instrument sponge and needle count was correct at the end of the case. The patient was taken to recovery room in stable condition.  Ruta Hinds, MD [facility] [place] Office: [phone] Pager: [phone]

## 2016-12-31 NOTE — Transfer of Care (Signed)
Immediate Anesthesia Transfer of Care Note  Patient: Frank Arias  Procedure(s) Performed: Procedure(s): RIGHT FEMORAL ENDARTERECTOMY (Right) PATCH PROFUNDOPLASTY, Right Femoral Profunda using Hemashield vascular patch. (Right)  Patient Location: PACU  Anesthesia Type:General  Level of Consciousness: awake and alert   Airway & Oxygen Therapy: Patient Spontanous Breathing and Patient connected to nasal cannula oxygen  Post-op Assessment: Report given to RN and Post -op Vital signs reviewed and stable  Post vital signs: Reviewed and stable  Last Vitals:  Vitals:   12/31/16 0612  BP: (!) 159/81  Pulse: 79  Resp: 19  Temp: 36.5 C  SpO2: 97%    Last Pain:  Vitals:   12/31/16 0612  TempSrc: Oral         Complications: No apparent anesthesia complications

## 2016-12-31 NOTE — Anesthesia Procedure Notes (Signed)
Arterial Line Insertion Start/End9/17/2018 7:00 AM Performed by: Izora Gala, CRNA  Patient location: Pre-op. Lidocaine 1% used for infiltration Left, radial was placed Catheter size: 20 G Hand hygiene performed  and maximum sterile barriers used   Attempts: 1 Procedure performed without using ultrasound guided technique. Following insertion, dressing applied and Biopatch. Post procedure assessment: unchanged  Patient tolerated the procedure well with no immediate complications.

## 2016-12-31 NOTE — Interval H&P Note (Signed)
History and Physical Interval Note:  12/31/2016 7:18 AM  Frank Arias  has presented today for surgery, with the diagnosis of Peripheral Vascular Disease with Claudication Right Lower Extremity I70.211 Occluded Right Femoral Artery  I74.3  The various methods of treatment have been discussed with the patient and family. After consideration of risks, benefits and other options for treatment, the patient has consented to  Procedure(s): RIGHT FEMORAL ENDARTERECTOMY PROFUNDOPLASTY (Right) as a surgical intervention .  The patient's history has been reviewed, patient examined, no change in status, stable for surgery.  I have reviewed the patient's chart and labs.  Questions were answered to the patient's satisfaction.     Ruta Hinds

## 2016-12-31 NOTE — H&P (View-Only) (Signed)
Referring Physician: Clayton Lefort MD  Patient name: Frank Arias MRN: [id number] DOB: 1941/03/28 Sex: male  REASON FOR CONSULT: Short distance claudication with rest pain right foot  HPI: KASHIS PENLEY is a 76 y.o. male who previously underwent aortobifemoral bypass graft by Dr. Kellie Simmering many years ago for abdominal aortic aneurysm. For the last 6 weeks his walking distance is decreased to about one block before extrinsic claudication symptoms in his right leg. He also has some occasional numbness and tingling in his right foot which is relieved by dangling the foot or moving around. He has no symptoms in the left leg. He has no existing wounds in the right or left leg. He has greater saphenous vein has previously been harvested from the right leg for coronary grafting. The left leg vein has been harvested for prior left femoropopliteal bypass. The patient recently had an abdominal aortogram lower extremity runoff by Dr. Nadyne Coombes. This showed patent aortobifemoral bypass graft. There is also a left femoral to below-knee popliteal bypass graft with two-vessel runoff to the left foot. In the right leg there was a distal common femoral occlusion with reconstitution of the more distal profunda collaterals were given off in the mid leg the right superficial femoral and popliteal arteries are occluded. There was two-vessel runoff via posterior tibial and peroneal arteries. These were reconstituted by collaterals from the mid thigh. The patient is on statin and aspirin. He is not smoking. Other medical problems include elevated cholesterol coronary artery disease both of which are stable.  Past Medical History:  Diagnosis Date  . CAD (coronary artery disease)   . Erectile dysfunction   . Hypercholesterolemia   . Myocardial infarction ([place])   . Peripheral vascular disease [facility])    Past Surgical History:  Procedure Laterality Date  . CORONARY ARTERY BYPASS GRAFT  2001    Median sternotomy for coronary  artery bypass grafting x 4 (left internal mammary artery to distal left anterior descending coronary artery, saphenous vein graft to ramus intermediate branch, sequential saphenous vein graft to second circumflex marginal branch, saphenous vein graft to posterior descending coronary artery).  . LOWER EXTREMITY ANGIOGRAPHY N/A 12/07/2016   Procedure: Lower Extremity Angiography;  Surgeon: Adrian Prows, MD;  Location: [place] [facility];  Service: Cardiovascular;  Laterality: N/A;  . NASAL SEPTUM SURGERY    . THROMBECTOMY      Family History  Problem Relation Age of Onset  . Coronary artery disease Unknown   . Lung cancer Unknown     SOCIAL HISTORY: Social History   Social History  . Marital status: Widowed    Spouse name: N/A  . Number of children: N/A  . Years of education: N/A   Occupational History  . Not on file.   Social History Main Topics  . Smoking status: Former Smoker    Types: Cigarettes    Quit date: 04/16/1998  . Smokeless tobacco: Never Used  . Alcohol use Yes     Comment: occasional  . Drug use: No  . Sexual activity: Not on file   Other Topics Concern  . Not on file   Social History Narrative  . No narrative on file    No Known Allergies  Current Outpatient Prescriptions  Medication Sig Dispense Refill  . aspirin EC 81 MG tablet Take 81 mg by mouth daily.    Marland Kitchen atorvastatin (LIPITOR) 10 MG tablet Take 10 mg by mouth daily.    . ramipril (ALTACE) 5 MG capsule Take  5 mg by mouth daily.     No current facility-administered medications for this visit.     ROS:   General:  No weight loss, Fever, chills  HEENT: No recent headaches, no nasal bleeding, no visual changes, no sore throat  Neurologic: No dizziness, blackouts, seizures. No recent symptoms of stroke or mini- stroke. No recent episodes of slurred speech, or temporary blindness.  Cardiac: No recent episodes of chest pain/pressure, no shortness of breath at rest.  No shortness of breath with  exertion.  Denies history of atrial fibrillation or irregular heartbeat  Vascular: + history of rest pain in feet.  + history of claudication.  No history of non-healing ulcer, No history of DVT   Pulmonary: No home oxygen, no productive cough, no hemoptysis,  No asthma or wheezing  Musculoskeletal:  [ ]  Arthritis, [ ]  Low back pain,  [ ]  Joint pain  Hematologic:No history of hypercoagulable state.  No history of easy bleeding.  No history of anemia  Gastrointestinal: No hematochezia or melena,  No gastroesophageal reflux, no trouble swallowing  Urinary: [ ]  chronic Kidney disease, [ ]  on HD - [ ]  MWF or [ ]  TTHS, [ ]  Burning with urination, [ ]  Frequent urination, [ ]  Difficulty urinating;   Skin: No rashes  Psychological: No history of anxiety,  No history of depression   Physical Examination  Vitals:   12/20/16 0816  BP: (!) 141/87  Pulse: 68  Temp: (!) 97.3 F (36.3 C)  TempSrc: Oral  SpO2: 98%  Weight: 168 lb (76.2 kg)  Height: 5\' 7"  (1.702 m)    Body mass index is 26.31 kg/m.  General:  Alert and oriented, no acute distress HEENT: Normal Neck: No bruit or JVD Pulmonary: Clear to auscultation bilaterally Cardiac: Regular Rate and Rhythm without murmur Abdomen: Soft, non-tender, non-distended, no mass, no scars Skin: No rash Extremity Pulses:  2+ radial, brachial, femoral,Absent right and left dorsalis pedis, posterior tibial pulses bilaterally Musculoskeletal: No deformity or edema  Neurologic: Upper and lower extremity motor 5/5 and symmetric  DATA:  I reviewed the patient's ABIs from Dr. Mila Palmer office from a few weeks ago. Left ABI was 0.8 right was 0 also reviewed the angiographic images which shows widely patent aortobifemoral bypass left femoral below-knee popliteal bypass two-vessel runoff occlusion distal right common femoral artery and proximal profunda with reconstitution of 2 tibial vessels distally  ASSESSMENT:  She has short distance claudication  and rest pain in the right leg. He does not have adequate conduit for a vein bypass to the tibial vessels. I believe using prosthetic for a tibial bypass would be of limited duration. With the best option for him currently via right common femoral endarterectomy with profundoplasty which should give him adequate flow to return him to his state 6 weeks ago. I discussed the patient today that I thought tibial bypass would not be indicated currently but that if he did not improve with femoral endarterectomy we could consider that down the road possibly using arm vein.   PLAN:  Right femoral endarterectomy with profundoplasty scheduled for 12/31/2016. Risks benefits possible, patient and procedure details were discussed the patient today including but limited to bleeding infection limb loss possible graft infection of the aortobifemoral bypass graft myocardial events. He understands and agrees to proceed.   Ruta Hinds, MD [facility] Office: [phone] Pager: [phone]

## 2016-12-31 NOTE — Anesthesia Procedure Notes (Signed)
Procedure Name: Intubation Date/Time: 12/31/2016 7:48 AM Performed by: Izora Gala Pre-anesthesia Checklist: Patient identified, Emergency Drugs available, Suction available and Patient being monitored Patient Re-evaluated:Patient Re-evaluated prior to induction Oxygen Delivery Method: Circle system utilized Preoxygenation: Pre-oxygenation with 100% oxygen Induction Type: IV induction Ventilation: Mask ventilation without difficulty Laryngoscope Size: Miller and 3 Grade View: Grade I Tube type: Oral Tube size: 7.5 mm Number of attempts: 1 Airway Equipment and Method: Stylet and LTA kit utilized Placement Confirmation: ETT inserted through vocal cords under direct vision,  positive ETCO2 and breath sounds checked- equal and bilateral Secured at: 22 cm Dental Injury: Teeth and Oropharynx as per pre-operative assessment

## 2017-01-01 ENCOUNTER — Encounter (HOSPITAL_COMMUNITY): Payer: Self-pay | Admitting: Vascular Surgery

## 2017-01-01 ENCOUNTER — Telehealth: Payer: Self-pay | Admitting: Vascular Surgery

## 2017-01-01 LAB — CBC
HCT: 32.2 % — ABNORMAL LOW (ref 39.0–52.0)
Hemoglobin: 10.7 g/dL — ABNORMAL LOW (ref 13.0–17.0)
MCH: 29 pg (ref 26.0–34.0)
MCHC: 33.2 g/dL (ref 30.0–36.0)
MCV: 87.3 fL (ref 78.0–100.0)
Platelets: 143 K/uL — ABNORMAL LOW (ref 150–400)
RBC: 3.69 MIL/uL — ABNORMAL LOW (ref 4.22–5.81)
RDW: 13.7 % (ref 11.5–15.5)
WBC: 9.3 K/uL (ref 4.0–10.5)

## 2017-01-01 LAB — BASIC METABOLIC PANEL WITH GFR
Anion gap: 5 (ref 5–15)
BUN: 16 mg/dL (ref 6–20)
CO2: 24 mmol/L (ref 22–32)
Calcium: 8.8 mg/dL — ABNORMAL LOW (ref 8.9–10.3)
Chloride: 108 mmol/L (ref 101–111)
Creatinine, Ser: 0.99 mg/dL (ref 0.61–1.24)
GFR calc Af Amer: >60 mL/min
GFR calc non Af Amer: >60 mL/min
Glucose, Bld: 123 mg/dL — ABNORMAL HIGH (ref 65–99)
Potassium: 4.2 mmol/L (ref 3.5–5.1)
Sodium: 137 mmol/L (ref 135–145)

## 2017-01-01 MED ORDER — OXYCODONE-ACETAMINOPHEN 5-325 MG PO TABS: 1.0000 | ORAL_TABLET | Freq: Four times a day (QID) | ORAL | 0 refills | Status: DC | PRN

## 2017-01-01 NOTE — Evaluation (Signed)
Physical Therapy Evaluation and Discharge Patient Details Name: Frank Arias MRN: [id number] DOB: Jun 10, 1940 Today's Date: 01/01/2017   History of Present Illness  Frank Arias is a 76 y.o. male who previously underwent aortobifemoral bypass graft by Dr. Kellie Simmering many years ago for abdominal aortic aneurysm. For the last 6 weeks his walking distance is decreased to about one block before extrinsic claudication symptoms in his right leg. s/p Right common femoral endarterectomy with profundaplasty 12/31/16  Clinical Impression  Patient evaluated by Physical Therapy with no further acute PT needs identified. All education has been completed and the patient has no further questions. Pt is independent with all mobility including ascend/descend of stairs to get into his house. Pt is currently See below for any follow-up Physical Therapy or equipment needs. PT is signing off. Thank you for this referral.     Follow Up Recommendations No PT follow up    Equipment Recommendations  None recommended by PT       Precautions / Restrictions Precautions Precautions: None Restrictions Weight Bearing Restrictions: No      Mobility  Bed Mobility Overal bed mobility: Independent                Transfers Overall transfer level: Independent Equipment used: None             General transfer comment: good power up and steadying in standing  Ambulation/Gait Ambulation/Gait assistance: Independent Ambulation Distance (Feet): 150 Feet Assistive device: None Gait Pattern/deviations: WFL(Within Functional Limits) Gait velocity: slowed Gait velocity interpretation: Below normal speed for age/gender General Gait Details: steady, safe, cadence, no obvious gait deviations,   Stairs Stairs: Yes Stairs assistance: Modified independent (Device/Increase time) Stair Management: One rail Right;Forwards;Alternating pattern Number of Stairs: 10 General stair comments: safe, steady  acsend/descend of stairs with handrail assist.   Wheelchair Mobility    Modified Rankin (Stroke Patients Only)       Balance Overall balance assessment: No apparent balance deficits (not formally assessed)                                           Pertinent Vitals/Pain Pain Assessment: No/denies pain    Home Living Family/patient expects to be discharged to:: Private residence Living Arrangements: Alone Available Help at Discharge: Family;Available PRN/intermittently Type of Home: House Home Access: Stairs to enter Entrance Stairs-Rails: Can reach both Entrance Stairs-Number of Steps: 3 Home Layout: One level Home Equipment: Walker - 2 wheels;Bedside commode;Grab bars - tub/shower      Prior Function Level of Independence: Independent         Comments: community ambulator, driver, and independent in ADLs, iADLs,      Hand Dominance   Dominant Hand: Right    Extremity/Trunk Assessment   Upper Extremity Assessment Upper Extremity Assessment: Overall WFL for tasks assessed    Lower Extremity Assessment Lower Extremity Assessment: Overall WFL for tasks assessed    Cervical / Trunk Assessment Cervical / Trunk Assessment: Normal  Communication   Communication: No difficulties  Cognition Arousal/Alertness: Awake/alert Behavior During Therapy: WFL for tasks assessed/performed Overall Cognitive Status: Within Functional Limits for tasks assessed                                        General Comments General comments (skin integrity,  edema, etc.): VSS        Assessment/Plan    PT Assessment Patent does not need any further PT services         PT Goals (Current goals can be found in the Care Plan section)  Acute Rehab PT Goals Patient Stated Goal: go home today     AM-PAC PT "6 Clicks" Daily Activity  Outcome Measure Difficulty turning over in bed (including adjusting bedclothes, sheets and blankets)?:  None Difficulty moving from lying on back to sitting on the side of the bed? : None Difficulty sitting down on and standing up from a chair with arms (e.g., wheelchair, bedside commode, etc,.)?: None Help needed moving to and from a bed to chair (including a wheelchair)?: None Help needed walking in hospital room?: None Help needed climbing 3-5 steps with a railing? : None 6 Click Score: 24    End of Session Equipment Utilized During Treatment: Gait belt Activity Tolerance: Patient tolerated treatment well Patient left: in bed;with call bell/phone within reach Nurse Communication: Mobility status PT Visit Diagnosis: Muscle weakness (generalized) (M62.81)    Time: 9373-4287 PT Time Calculation (min) (ACUTE ONLY): 14 min   Charges:   PT Evaluation $PT Eval Low Complexity: 1 Low     PT G Codes:        Tyhir Schwan B. Migdalia Dk PT, DPT Acute Rehabilitation  [phone] Pager [phone]    [place] 01/01/2017, 9:01 AM

## 2017-01-01 NOTE — Progress Notes (Signed)
D/C instructions reviewed with pt and daughter. Paper scripts given. Pt getting dressed and will call when ready to leave.

## 2017-01-01 NOTE — Discharge Instructions (Signed)
[facility]  Discharge instructions  Please refer to the following instruction for your post-procedure care. Your surgeon or physician assistant will discuss any changes with you.  Activity  You are encouraged to walk as much as you can. You can slowly return to normal activities during the month after your surgery. Avoid strenuous activity and heavy lifting until your doctor tells you it's OK. Avoid activities such as vacuuming or swinging a golf club. Do not drive until your doctor give the OK and you are no longer taking prescription pain medications. It is also normal to have difficulty with sleep habits, eating and bowel movement after surgery. These will go away with time.  Bathing/Showering  You may shower after you go home. Do not soak in a bathtub, hot tub, or swim until the incision heals completely.  Incision Care  Clean your incision with mild soap and water. Shower every day. Pat the area dry with a clean towel. You do not need a bandage unless otherwise instructed. Do not apply any ointments or creams to your incision. If you have open wounds you will be instructed how to care for them or a visiting nurse may be arranged for you. If you have staples or sutures along your incision they will be removed at your post-op appointment. You may have skin glue on your incision. Do not peel it off. It will come off on its own in about one week.  Wash the groin wound with soap and water daily and pat dry. (No tub bath-only shower)  Then put a dry gauze or washcloth in the groin to keep this area dry to help prevent wound infection.  Do this daily and as needed.  Do not use Vaseline or neosporin on your incisions.  Only use soap and water on your incisions and then protect and keep dry.  Diet  Resume your normal diet. There are no special food restrictions following this procedure. A low fat/ low cholesterol diet is recommended for all patients with  vascular disease. In order to heal from your surgery, it is CRITICAL to get adequate nutrition. Your body requires vitamins, minerals, and protein. Vegetables are the best source of vitamins and minerals. Vegetables also provide the perfect balance of protein. Processed food has little nutritional value, so try to avoid this.  Medications  Resume taking all your medications unless your doctor or nurse practitioner tells you not to. If your incision is causing pain, you may take over-the-counter pain relievers such as acetaminophen (Tylenol). If you were prescribed a stronger pain medication, please aware these medication can cause nausea and constipation. Prevent nausea by taking the medication with a snack or meal. Avoid constipation by drinking plenty of fluids and eating foods with high amount of fiber, such as fruits, vegetables, and grains. Take Colase 100 mg (an over-the-counter stool softener) twice a day as needed for constipation. Do not take Tylenol if you are taking prescription pain medications.  Follow Up  Our office will schedule a follow up appointment 2-3 weeks following discharge.  Please call us immediately for any of the following conditions  Severe or worsening pain in your legs or feet while at rest or while walking Increase pain, redness, warmth, or drainage (pus) from your incision site(s) Fever of 101 degree or higher The swelling in your leg with the bypass suddenly worsens and becomes more painful than when you were in the hospital If you have been instructed to feel your  graft pulse then you should do so every day. If you can no longer feel this pulse, call the office immediately. Not all patients are given this instruction.  Leg swelling is common after leg bypass surgery.  The swelling should improve over a few months following surgery. To improve the swelling, you may elevate your legs above the level of your heart while you are sitting or resting. Your surgeon or  physician assistant may ask you to apply an ACE wrap or wear compression (TED) stockings to help to reduce swelling.  Reduce your risk of vascular disease  Stop smoking. If you would like help call [facility] at 1-800-QUIT-NOW 516-056-2432) or [place] at 910-141-3632.  Manage your cholesterol Maintain a desired weight Control your diabetes weight Control your diabetes Keep your blood pressure down  If you have any questions, please call the office at [phone]

## 2017-01-01 NOTE — Telephone Encounter (Signed)
-----   Message from Mena Goes, RN sent at 01/01/2017  9:26 AM EDT ----- Regarding: 2 weeks   ----- Message ----- From: Gabriel Earing, PA-C Sent: 01/01/2017   8:00 AM To: [facility] Charge Pool  S/p right femoral endarterectomy.  PT will need abi's.    F/u with Dr. Oneida Alar in 2 weeks.  Thanks

## 2017-01-01 NOTE — Progress Notes (Signed)
OT Cancellation Note  Patient Details Name: Frank Arias MRN: [id number] DOB: 03-16-1941   Cancelled Treatment:    Reason Eval/Treat Not Completed: OT screened, no needs identified, will sign off. Pt currently independent in ADL participation in hospital setting. No OT needs identified at this time. Will sign off.   Norman Herrlich, MS OTR/L  Pager: [place] [name] 01/01/2017, 9:05 AM

## 2017-01-01 NOTE — Care Management Note (Addendum)
Case Management Note Marvetta Gibbons RN, BSN Unit 4E-Case Manager [phone]  Patient Details  Name: ZIQUAN FIDEL MRN: [id number] Date of Birth: 03/28/1941  Subjective/Objective:   Pt admitted s/p:  Rightcommon femoral endarterectomy with profundaplasty                  Action/Plan: PTA pt lived at home- per PT eval this am no f/u recommendations made- notified by [facility] of preop referral for any HH needs- plan to d/c home today- no CM needs noted for discharge.   Expected Discharge Date:  01/01/17               Expected Discharge Plan:  Home/Self Care  In-House Referral:  NA  Discharge planning Services  CM Consult  Post Acute Care Choice:  NA Choice offered to:  NA  DME Arranged:    DME Agency:     HH Arranged:    HH Agency:     Status of Service:  Completed, signed off  If discussed at [place] of Stay Meetings, dates discussed:    Discharge Disposition: home/self care   Additional Comments:  Dawayne Patricia, RN 01/01/2017, 9:45 AM

## 2017-01-01 NOTE — Discharge Summary (Signed)
Discharge Summary    Frank Arias 29-Oct-1940 76 y.o. male  [id number]  Admission Date: 12/31/2016  Discharge Date: 01/01/17  Physician: Elam Dutch, MD  Admission Diagnosis: Peripheral Vascular Disease with Claudication Right Lower Extremity I70.211 Occluded Right Femoral Artery  I74.3   HPI:   This is a 76 y.o. male who previously underwent aortobifemoral bypass graft by Dr. Kellie Simmering many years ago for abdominal aortic aneurysm. For the last 6 weeks his walking distance is decreased to about one block before extrinsic claudication symptoms in his right leg. He also has some occasional numbness and tingling in his right foot which is relieved by dangling the foot or moving around. He has no symptoms in the left leg. He has no existing wounds in the right or left leg. He has greater saphenous vein has previously been harvested from the right leg for coronary grafting. The left leg vein has been harvested for prior left femoropopliteal bypass. The patient recently had an abdominal aortogram lower extremity runoff by Dr. Nadyne Coombes. This showed patent aortobifemoral bypass graft. There is also a left femoral to below-knee popliteal bypass graft with two-vessel runoff to the left foot. In the right leg there was a distal common femoral occlusion with reconstitution of the more distal profunda collaterals were given off in the mid leg the right superficial femoral and popliteal arteries are occluded. There was two-vessel runoff via posterior tibial and peroneal arteries. These were reconstituted by collaterals from the mid thigh. The patient is on statin and aspirin. He is not smoking. Other medical problems include elevated cholesterol coronary artery disease both of which are stable.  Hospital Course:  The patient was admitted to the hospital and taken to the operating room on 12/31/2016 and underwent: Right common femoral endarterectomy with profundaplasty    The pt tolerated the procedure  well and was transported to the PACU in good condition.   By POD 1, he was doing well.  He had brisk doppler signals in the right PT/peroneal.  He has ambulated well.  He will be discharged home today.  The remainder of the hospital course consisted of increasing mobilization and increasing intake of solids without difficulty.  CBC    Component Value Date/Time   WBC 9.3 01/01/2017 0343   RBC 3.69 (L) 01/01/2017 0343   HGB 10.7 (L) 01/01/2017 0343   HCT 32.2 (L) 01/01/2017 0343   PLT 143 (L) 01/01/2017 0343   MCV 87.3 01/01/2017 0343   MCH 29.0 01/01/2017 0343   MCHC 33.2 01/01/2017 0343   RDW 13.7 01/01/2017 0343    BMET    Component Value Date/Time   NA 137 01/01/2017 0343   K 4.2 01/01/2017 0343   CL 108 01/01/2017 0343   CO2 24 01/01/2017 0343   GLUCOSE 123 (H) 01/01/2017 0343   BUN 16 01/01/2017 0343   CREATININE 0.99 01/01/2017 0343   CALCIUM 8.8 (L) 01/01/2017 0343   GFRNONAA >60 01/01/2017 0343   GFRAA >60 01/01/2017 0343    Discharge Diagnosis:  Peripheral Vascular Disease with Claudication Right Lower Extremity I70.211 Occluded Right Femoral Artery  I74.3  Secondary Diagnosis: Patient Active Problem List   Diagnosis Date Noted  . PAD (peripheral artery disease) ([place]) 12/31/2016  . Claudication ([place]) 12/07/2016   Past Medical History:  Diagnosis Date  . CAD (coronary artery disease)   . Erectile dysfunction   . Hypercholesterolemia   . Myocardial infarction ([place])   . Peripheral vascular disease ([place])   . PFO (patent  foramen ovale)    on 12/06/16 echo [facility])     Allergies as of 01/01/2017   No Known Allergies     Medication List    TAKE these medications   aspirin EC 81 MG tablet Take 81 mg by mouth daily.   atorvastatin 10 MG tablet Commonly known as:  LIPITOR Take 10 mg by mouth daily.   oxyCODONE-acetaminophen 5-325 MG tablet Commonly known as:  PERCOCET/ROXICET Take 1 tablet by mouth every 6 (six) hours as needed for  moderate pain.   ramipril 5 MG capsule Commonly known as:  ALTACE Take 5 mg by mouth daily.            Discharge Care Instructions        Start     Ordered   01/01/17 0000  oxyCODONE-acetaminophen (PERCOCET/ROXICET) 5-325 MG tablet  Every 6 hours PRN    Question:  Supervising Provider  Answer:  Elam Dutch   01/01/17 0800      Instructions:   [facility]  Discharge instructions  Lower Extremity Bypass Surgery  Please refer to the following instruction for your post-procedure care. Your surgeon or physician assistant will discuss any changes with you.  Activity  You are encouraged to walk as much as you can. You can slowly return to normal activities during the month after your surgery. Avoid strenuous activity and heavy lifting until your doctor tells you it's OK. Avoid activities such as vacuuming or swinging a golf club. Do not drive until your doctor give the OK and you are no longer taking prescription pain medications. It is also normal to have difficulty with sleep habits, eating and bowel movement after surgery. These will go away with time.  Bathing/Showering  You may shower after you go home. Do not soak in a bathtub, hot tub, or swim until the incision heals completely.  Incision Care  Clean your incision with mild soap and water. Shower every day. Pat the area dry with a clean towel. You do not need a bandage unless otherwise instructed. Do not apply any ointments or creams to your incision. If you have open wounds you will be instructed how to care for them or a visiting nurse may be arranged for you. If you have staples or sutures along your incision they will be removed at your post-op appointment. You may have skin glue on your incision. Do not peel it off. It will come off on its own in about one week.  Wash the groin wound with soap and water daily and pat dry. (No tub bath-only shower)  Then put a dry gauze or  washcloth in the groin to keep this area dry to help prevent wound infection.  Do this daily and as needed.  Do not use Vaseline or neosporin on your incisions.  Only use soap and water on your incisions and then protect and keep dry.  Diet  Resume your normal diet. There are no special food restrictions following this procedure. A low fat/ low cholesterol diet is recommended for all patients with vascular disease. In order to heal from your surgery, it is CRITICAL to get adequate nutrition. Your body requires vitamins, minerals, and protein. Vegetables are the best source of vitamins and minerals. Vegetables also provide the perfect balance of protein. Processed food has little nutritional value, so try to avoid this.  Medications  Resume taking all your medications unless your doctor or nurse practitioner tells you not to. If  your incision is causing pain, you may take over-the-counter pain relievers such as acetaminophen (Tylenol). If you were prescribed a stronger pain medication, please aware these medication can cause nausea and constipation. Prevent nausea by taking the medication with a snack or meal. Avoid constipation by drinking plenty of fluids and eating foods with high amount of fiber, such as fruits, vegetables, and grains. Take Colase 100 mg (an over-the-counter stool softener) twice a day as needed for constipation. Do not take Tylenol if you are taking prescription pain medications.  Follow Up  Our office will schedule a follow up appointment 2-3 weeks following discharge.  Please call us immediately for any of the following conditions  .Severe or worsening pain in your legs or feet while at rest or while walking .Increase pain, redness, warmth, or drainage (pus) from your incision site(s) Fever of 101 degree or higher The swelling in your leg with the bypass suddenly worsens and becomes more painful than when you were in the hospital If you have been instructed to feel your  graft pulse then you should do so every day. If you can no longer feel this pulse, call the office immediately. Not all patients are given this instruction.  Leg swelling is common after leg bypass surgery.  The swelling should improve over a few months following surgery. To improve the swelling, you may elevate your legs above the level of your heart while you are sitting or resting. Your surgeon or physician assistant may ask you to apply an ACE wrap or wear compression (TED) stockings to help to reduce swelling.  Reduce your risk of vascular disease  Stop smoking. If you would like help call [facility] at 1-800-QUIT-NOW (724)264-2954) or [place] at 2766815419.  Manage your cholesterol Maintain a desired weight Control your diabetes weight Control your diabetes Keep your blood pressure down  If you have any questions, please call the office at [phone]   Prescriptions given: Roxicet #20 No Refill  Disposition: home  Patient's condition: is Good  Follow up: 1. Dr. Oneida Alar in 2 weeks with ABI's   Leontine Locket, PA-C [facility] [phone] 01/01/2017  8:02 AM

## 2017-01-01 NOTE — Telephone Encounter (Signed)
Sched appt 01/31/17; lab at 2:00 and MD at 2:30. Lm on hm#.

## 2017-01-01 NOTE — Progress Notes (Addendum)
  Progress Note    01/01/2017 7:54 AM 1 Day Post-Op  Subjective:  Ready to go home  Afebrile HR  60's-80's NSR 703'[id number]'F systolic 81% RA  Vitals:   01/01/17 0115 01/01/17 0422  BP:  (!) 114/55  Pulse:    Resp: 12 16  Temp:  98 F (36.7 C)  SpO2: 96% 98%    Physical Exam: Cardiac:  regular Lungs:  Non labored Incisions:  Clean and dry with some ecchymosis  Extremities:  Brisk right PT/peroneal doppler signals   CBC    Component Value Date/Time   WBC 9.3 01/01/2017 0343   RBC 3.69 (L) 01/01/2017 0343   HGB 10.7 (L) 01/01/2017 0343   HCT 32.2 (L) 01/01/2017 0343   PLT 143 (L) 01/01/2017 0343   MCV 87.3 01/01/2017 0343   MCH 29.0 01/01/2017 0343   MCHC 33.2 01/01/2017 0343   RDW 13.7 01/01/2017 0343    BMET    Component Value Date/Time   NA 137 01/01/2017 0343   K 4.2 01/01/2017 0343   CL 108 01/01/2017 0343   CO2 24 01/01/2017 0343   GLUCOSE 123 (H) 01/01/2017 0343   BUN 16 01/01/2017 0343   CREATININE 0.99 01/01/2017 0343   CALCIUM 8.8 (L) 01/01/2017 0343   GFRNONAA >60 01/01/2017 0343   GFRAA >60 01/01/2017 0343    INR    Component Value Date/Time   INR 1.06 12/26/2016 0936     Intake/Output Summary (Last 24 hours) at 01/01/17 0754 Last data filed at 01/01/17 0400  Gross per 24 hour  Intake          3516.25 ml  Output             3025 ml  Net           491.25 ml     Assessment:  76 y.o. male is s/p:   Right common femoral endarterectomy with profundaplasty  1 Day Post-Op  Plan: -pt doing well this am with brisk right PT and peroneal doppler signals -acute surgical blood loss anemia-tolerating -pt has walked and is ready to discharge -discussed groin wound care with pt -continue asa/statin   Leontine Locket, PA-C [facility] [phone] 01/01/2017 7:54 AM

## 2017-01-02 DIAGNOSIS — I2510 Atherosclerotic heart disease of native coronary artery without angina pectoris: Secondary | ICD-10-CM | POA: Diagnosis not present

## 2017-01-02 NOTE — Consult Note (Signed)
           [facility] CM Primary Care Navigator  01/02/2017  Frank Arias 1941/03/27 [id number]   Went to seepatient at the bedsideto identify possible discharge needs but he was already discharged per staff report.  Patient was discharged home yesterday.  Primary care provider's officeis listed as doing transition of care (TOC). Per chart review [name], M. Note), patient has scheduled follow-up appointment on 01/31/17 at 2:30 pm.  Primary care provider's office called [name]) to notify of patient's discharge and need for post hospital follow-up and transition of care.     For questions, please contact:  Dannielle Huh, BSN, RN- [facility] Primary Care Navigator  Telephone: [phone] [place]

## 2017-01-03 DIAGNOSIS — I739 Peripheral vascular disease, unspecified: Secondary | ICD-10-CM | POA: Diagnosis not present

## 2017-01-03 DIAGNOSIS — I2510 Atherosclerotic heart disease of native coronary artery without angina pectoris: Secondary | ICD-10-CM | POA: Diagnosis not present

## 2017-01-03 DIAGNOSIS — Z48812 Encounter for surgical aftercare following surgery on the circulatory system: Secondary | ICD-10-CM | POA: Diagnosis not present

## 2017-01-31 ENCOUNTER — Ambulatory Visit (INDEPENDENT_AMBULATORY_CARE_PROVIDER_SITE_OTHER): Payer: Self-pay | Admitting: Vascular Surgery

## 2017-01-31 ENCOUNTER — Other Ambulatory Visit: Payer: Self-pay | Admitting: Vascular Surgery

## 2017-01-31 ENCOUNTER — Ambulatory Visit (HOSPITAL_COMMUNITY)
Admission: RE | Admit: 2017-01-31 | Discharge: 2017-01-31 | Disposition: A | Discharge status: Home / Self Care | Payer: Medicare HMO | Source: Ambulatory Visit | Attending: Vascular Surgery | Admitting: Vascular Surgery

## 2017-01-31 ENCOUNTER — Encounter: Payer: Self-pay | Admitting: Vascular Surgery

## 2017-01-31 VITALS — BP 149/83 | HR 68 | Temp 97.6°F | Resp 16 | Ht 66.0 in | Wt 167.0 lb

## 2017-01-31 DIAGNOSIS — I739 Peripheral vascular disease, unspecified: Secondary | ICD-10-CM | POA: Diagnosis not present

## 2017-01-31 NOTE — Progress Notes (Signed)
Vitals:   01/31/17 1440  BP: (!) 153/85  Pulse: 68  Resp: 16  Temp: 97.6 F (36.4 C)  SpO2: 99%  Weight: 167 lb (75.8 kg)  Height: 5\' 6"  (1.676 m)

## 2017-01-31 NOTE — Progress Notes (Signed)
  POST OPERATIVE OFFICE NOTE    CC:  F/u for surgery  HPI:  This is a 76 y.o. male who is s/p Rightcommon femoral endarterectomy with profundaplasty by Dr. Oneida Alar on 12/31/16.  He presents today for follow up.  He states he is now walking the grocery store and not riding on the cart.  He denies any pain.  He is taking a daily aspirin and statin.   No Known Allergies  Current Outpatient Prescriptions  Medication Sig Dispense Refill  . aspirin EC 81 MG tablet Take 81 mg by mouth daily.    Marland Kitchen atorvastatin (LIPITOR) 10 MG tablet Take 10 mg by mouth daily.    . ramipril (ALTACE) 5 MG capsule Take 5 mg by mouth daily.    Marland Kitchen oxyCODONE-acetaminophen (PERCOCET/ROXICET) 5-325 MG tablet Take 1 tablet by mouth every 6 (six) hours as needed for moderate pain. (Patient not taking: Reported on 01/31/2017) 20 tablet 0   No current facility-administered medications for this visit.      ROS:  See HPI  Physical Exam:  Vitals:   01/31/17 1440 01/31/17 1441  BP: (!) 153/85 (!) 149/83  Pulse: 68 68  Resp: 16   Temp: 97.6 F (36.4 C)   SpO2: 99%    Vitals:   01/31/17 1440  Weight: 167 lb (75.8 kg)  Height: 5\' 6"  (1.676 m)    Incision:  Well healed Extremities:  Bilateral feet are warm;  Monophasic doppler signals right PT/peroneal  ABI's 01/31/17: Right:  0.55 (M) Left:  1.05 (PT-B & DP-M)  Previous ABI's: Right:  0 Left:  0.8   Assessment/Plan:  This is a 76 y.o. male who is s/p: Rightcommon femoral endarterectomy with profundaplasty   -pt doing well without any complaints.  His ABI today is 0.55 and he has had improvement in his sx -he will f/u in 6 months with ABI's  -he will call sooner if needed.    Leontine Locket, PA-C [facility] [phone]  Clinic MD:  Pt seen and examined with Dr. Oneida Alar

## 2017-01-31 NOTE — Progress Notes (Signed)
Patient is a 76 year old male who returns for postoperative follow-up today. He underwent right common femoral endarterectomy with profundoplasty on September 17. This was done for rest pain in the right foot. His rest pain has completely resolved. He denies any claudication symptoms. He does have downstream disease with a chronic right SFA occlusion and popliteal artery occlusion. He has 2 vessel runoff via the posterior tibial and peroneal arteries. He denies any incisional drainage. He has previously had an aortobifemoral bypass in the remote past.  Data: Patient had bilateral ABIs performed today which were 0.55 on the right 1.05 on the left.  This is improved on the right from 0.  Assessment: Doing well status post right femoral endarterectomy with profundoplasty with resolved claudication and rest pain symptoms.  Plan: The patient will have a follow-up ABI in 6 months.  Ruta Hinds, MD [facility] [place] Office: [phone] Pager: [phone]

## 2017-02-18 NOTE — Addendum Note (Signed)
Addended by: Lianne Cure A on: 02/18/2017 04:50 PM   Modules accepted: Orders

## 2017-03-13 ENCOUNTER — Telehealth: Payer: Self-pay | Admitting: *Deleted

## 2017-03-13 NOTE — Telephone Encounter (Signed)
Frank Arias called to report that he has had an increase in tingling and pain in his right foot x 2 days. He is not having any pain in the thigh, buttock or groin. He is afebrile and states that the has not fallen or injured his foot in any way. He says that there is no skin discoloration or wounds visible. Frank Arias states that he is not a diabetic and has had no previous spinal radiculopathy symptoms. Since he is post right femoral endarterectomy on 12-31-16 by Dr. Oneida Alar, I have suggested that he be evaluated by our NP asap, including an ABI if possible. He voiced understanding and agreement of this plan. If his foot should get worse, show signs of skin discoloration or breakdown, or have drainage, he will go to the [facility] ED for evaluation.

## 2017-03-14 NOTE — Telephone Encounter (Signed)
Pt changed mind and did not want to come into the office.

## 2017-06-18 DIAGNOSIS — L989 Disorder of the skin and subcutaneous tissue, unspecified: Secondary | ICD-10-CM | POA: Diagnosis not present

## 2017-06-18 DIAGNOSIS — I2510 Atherosclerotic heart disease of native coronary artery without angina pectoris: Secondary | ICD-10-CM | POA: Diagnosis not present

## 2017-06-18 DIAGNOSIS — C4491 Basal cell carcinoma of skin, unspecified: Secondary | ICD-10-CM | POA: Diagnosis not present

## 2017-06-18 DIAGNOSIS — F4321 Adjustment disorder with depressed mood: Secondary | ICD-10-CM | POA: Diagnosis not present

## 2017-06-18 DIAGNOSIS — E782 Mixed hyperlipidemia: Secondary | ICD-10-CM | POA: Diagnosis not present

## 2017-06-26 DIAGNOSIS — D0439 Carcinoma in situ of skin of other parts of face: Secondary | ICD-10-CM | POA: Diagnosis not present

## 2017-06-26 DIAGNOSIS — D2239 Melanocytic nevi of other parts of face: Secondary | ICD-10-CM | POA: Diagnosis not present

## 2017-08-06 DIAGNOSIS — Z85828 Personal history of other malignant neoplasm of skin: Secondary | ICD-10-CM | POA: Diagnosis not present

## 2017-08-06 DIAGNOSIS — L570 Actinic keratosis: Secondary | ICD-10-CM | POA: Diagnosis not present

## 2017-08-06 DIAGNOSIS — Z08 Encounter for follow-up examination after completed treatment for malignant neoplasm: Secondary | ICD-10-CM | POA: Diagnosis not present

## 2017-08-06 DIAGNOSIS — X32XXXD Exposure to sunlight, subsequent encounter: Secondary | ICD-10-CM | POA: Diagnosis not present

## 2017-08-08 ENCOUNTER — Ambulatory Visit: Payer: Medicare HMO | Admitting: Vascular Surgery

## 2017-08-08 ENCOUNTER — Encounter: Payer: Self-pay | Admitting: Vascular Surgery

## 2017-08-08 ENCOUNTER — Ambulatory Visit (HOSPITAL_COMMUNITY)
Admission: RE | Admit: 2017-08-08 | Discharge: 2017-08-08 | Disposition: A | Discharge status: Home / Self Care | Payer: Medicare HMO | Source: Ambulatory Visit | Attending: Vascular Surgery | Admitting: Vascular Surgery

## 2017-08-08 ENCOUNTER — Other Ambulatory Visit: Payer: Self-pay

## 2017-08-08 VITALS — BP 133/82 | HR 66 | Temp 97.9°F | Resp 16 | Ht 66.0 in | Wt 164.0 lb

## 2017-08-08 DIAGNOSIS — E785 Hyperlipidemia, unspecified: Secondary | ICD-10-CM | POA: Insufficient documentation

## 2017-08-08 DIAGNOSIS — I739 Peripheral vascular disease, unspecified: Secondary | ICD-10-CM | POA: Diagnosis not present

## 2017-08-08 DIAGNOSIS — Z87891 Personal history of nicotine dependence: Secondary | ICD-10-CM | POA: Insufficient documentation

## 2017-08-08 NOTE — Progress Notes (Signed)
HISTORY AND PHYSICAL     CC:  Post-surgical surveillance  Requesting Provider:  Shirline Frees, MD  HPI: This is a 77 y.o. male s/p right CFA endarterectomy with profundaplasty by Dr. Oneida Alar 12/31/2016 who is here for routine follow-up. History of aortobifem and left fem-BK popliteal graft. He is doing well today and states he can perform his daily activities without pain. He complains of continued tingling in his bilateral feet.  He is not smoking and has quit over 10 years.  Past Medical History:  Diagnosis Date  . CAD (coronary artery disease)   . Erectile dysfunction   . Hypercholesterolemia   . Myocardial infarction ([place])   . Peripheral vascular disease ([place])   . PFO (patent foramen ovale)    on 12/06/16 echo [facility])    Past Surgical History:  Procedure Laterality Date  . CORONARY ARTERY BYPASS GRAFT  2001    Median sternotomy for coronary artery bypass grafting x 4 (left internal mammary artery to distal left anterior descending coronary artery, saphenous vein graft to ramus intermediate branch, sequential saphenous vein graft to second circumflex marginal branch, saphenous vein graft to posterior descending coronary artery).  Marland Kitchen ENDARTERECTOMY FEMORAL Right 12/31/2016   Procedure: RIGHT FEMORAL ENDARTERECTOMY;  Surgeon: Elam Dutch, MD;  Location: [place];  Service: Vascular;  Laterality: Right;  . LOWER EXTREMITY ANGIOGRAPHY N/A 12/07/2016   Procedure: Lower Extremity Angiography;  Surgeon: Adrian Prows, MD;  Location: [place] [facility];  Service: Cardiovascular;  Laterality: N/A;  . MULTIPLE TOOTH EXTRACTIONS    . NASAL SEPTUM SURGERY    . PATCH ANGIOPLASTY Right 12/31/2016   Procedure: PATCH PROFUNDOPLASTY, Right Femoral Profunda using Hemashield vascular patch.;  Surgeon: Elam Dutch, MD;  Location: [facility] OR;  Service: Vascular;  Laterality: Right;  . THROMBECTOMY    . TONSILLECTOMY      No Known Allergies  Current Outpatient Medications    Medication Sig Dispense Refill  . atorvastatin (LIPITOR) 10 MG tablet Take 10 mg by mouth daily.     No current facility-administered medications for this visit.     Family History  Problem Relation Age of Onset  . Coronary artery disease Unknown   . Lung cancer Unknown     Social History   Socioeconomic History  . Marital status: Widowed    Spouse name: Not on file  . Number of children: Not on file  . Years of education: Not on file  . Highest education level: Not on file  Occupational History  . Not on file  Social Needs  . Financial resource strain: Not on file  . Food insecurity:    Worry: Not on file    Inability: Not on file  . Transportation needs:    Medical: Not on file    Non-medical: Not on file  Tobacco Use  . Smoking status: Former Smoker    Types: Cigarettes    Last attempt to quit: 04/16/1998    Years since quitting: 19.3  . Smokeless tobacco: Never Used  Substance and Sexual Activity  . Alcohol use: Yes    Comment: occasional  . Drug use: No  . Sexual activity: Not on file  Lifestyle  . Physical activity:    Days per week: Not on file    Minutes per session: Not on file  . Stress: Not on file  Relationships  . Social connections:    Talks on phone: Not on file    Gets together: Not on file  Attends religious service: Not on file    Active member of club or organization: Not on file    Attends meetings of clubs or organizations: Not on file    Relationship status: Not on file  . Intimate partner violence:    Fear of current or ex partner: Not on file    Emotionally abused: Not on file    Physically abused: Not on file    Forced sexual activity: Not on file  Other Topics Concern  . Not on file  Social History Narrative  . Not on file     REVIEW OF SYSTEMS:   [X]  denotes positive finding, [ ]  denotes negative finding Cardiac  Comments:  Chest pain or chest pressure:    Shortness of breath upon exertion:    Short of breath when  lying flat:    Irregular heart rhythm:        Vascular    Pain in calf, thigh, or hip brought on by ambulation:    Pain in feet at night that wakes you up from your sleep:     Blood clot in your veins:    Leg swelling:         Pulmonary    Oxygen at home:    Productive cough:     Wheezing:         Neurologic    Sudden weakness in arms or legs:     Sudden numbness in arms or legs:     Sudden onset of difficulty speaking or slurred speech:    Temporary loss of vision in one eye:     Problems with dizziness:  x       Gastrointestinal    Blood in stool:     Vomited blood:         Genitourinary    Burning when urinating:     Blood in urine:        Psychiatric    Major depression:         Hematologic    Bleeding problems:    Problems with blood clotting too easily:        Skin    Rashes or ulcers:        Constitutional    Fever or chills:      PHYSICAL EXAMINATION:  Vitals:   08/08/17 0919  BP: 133/82  Pulse: 66  Resp: 16  Temp: 97.9 F (36.6 C)  SpO2: 99%   Vitals:   08/08/17 0919  Weight: 164 lb (74.4 kg)  Height: 5\' 6"  (1.676 m)   Body mass index is 26.47 kg/m.  General:  WDWN in NAD; vital signs documented above Gait: Normal HENT: WNL, normocephalic Pulmonary: normal non-labored breathing , without Rales, rhonchi,  wheezing Cardiac: regular HR, without  Murmurs without carotid bruit Skin: without rashes Vascular Exam/Pulses:  Right Left  Femoral 2+ (normal) 2+ (normal)  DP 1+ (weak) 2+ (normal)  PT 1+ (weak) 2+ (normal)   Extremities: without ischemic changes, without Gangrene , without cellulitis; without open wounds;  Musculoskeletal: no muscle wasting or atrophy  Neurologic: A&O X 3;  No focal weakness or paresthesias are detected Psychiatric:  The pt has Normal affect.   Non-Invasive Vascular Imaging:   ABI 08/08/2017 R- 0.56, toe pressure 53 L- 1.03, toe pressure 99 Essentially unchanged from 01/31/2017  Pt meds  includes: Statin:  Yes.   Beta Blocker:  No. Aspirin:  No. ACEI:  No. ARB:  No. CCB use:  No Other Antiplatelet/Anticoagulant:  No   ASSESSMENT/PLAN:: 77 y.o. male s/p right CFA endarterectomy with profundaplasty by Dr. Oneida Alar 12/31/2016 and remote history of aortobifem and left fem-pop bypass grafts. He is stable and doing well from a vascular standpoint. Plan to return in 1 year with ABIs and to see NP/PA, or sooner if problems arise.   Lynnell Jude. Tressie Stalker, PA-S [facility] [phone]  Clinic MD:  Pt seen and examined with Dr. Oneida Alar   History and exam findings as above.  Patient recently had a right femoral endarterectomy and is doing well from this.  He currently has normal ABIs on the left and a reasonable ABI on the right side that is asymptomatic.  He will follow-up in 1 year with ABIs and see our nurse practitioner.  We will reschedule him for noninvasive vascular studies when he returns for the office visit.  Ruta Hinds, MD [facility] [place] Office: [phone] Pager: [phone]

## 2018-05-26 DIAGNOSIS — I739 Peripheral vascular disease, unspecified: Secondary | ICD-10-CM | POA: Diagnosis not present

## 2018-05-26 DIAGNOSIS — I10 Essential (primary) hypertension: Secondary | ICD-10-CM | POA: Diagnosis not present

## 2018-05-26 DIAGNOSIS — E7800 Pure hypercholesterolemia, unspecified: Secondary | ICD-10-CM | POA: Diagnosis not present

## 2018-05-26 DIAGNOSIS — R42 Dizziness and giddiness: Secondary | ICD-10-CM | POA: Diagnosis not present

## 2018-05-26 DIAGNOSIS — I2510 Atherosclerotic heart disease of native coronary artery without angina pectoris: Secondary | ICD-10-CM | POA: Diagnosis not present

## 2018-05-27 ENCOUNTER — Telehealth: Payer: Self-pay

## 2018-05-27 NOTE — Telephone Encounter (Signed)
NOTES ON FILE 

## 2018-05-28 ENCOUNTER — Telehealth: Payer: Self-pay

## 2018-05-28 NOTE — Telephone Encounter (Signed)
05-27-18/2:34pm. Rec'd Urgent referrel from [facility]/Dr. Antony Contras (refer to cardiology for consult...consider Holter monitor) [id number]. Pt schedule first for Holter, however Pt will need to see Cardiologist first.  05-28-18/4:00pm.Called Pt to schedule New Pt appt([phone]), getting con't hang ups. Called referring office. Left Shirlee([phone]) detailed message unable to reach Pt for scheduling. I will mail Pt a letter to call for scheduling. renee

## 2018-05-29 NOTE — Telephone Encounter (Signed)
05-29-18. Pt walked in this morn in reference to referral. Pt was given New Pt appt on 06-23-18 at 8:00am.  Renee [id number]/[id number]

## 2018-06-22 ENCOUNTER — Encounter: Payer: Self-pay | Admitting: Cardiology

## 2018-06-22 DIAGNOSIS — E785 Hyperlipidemia, unspecified: Secondary | ICD-10-CM | POA: Insufficient documentation

## 2018-06-22 DIAGNOSIS — R42 Dizziness and giddiness: Secondary | ICD-10-CM | POA: Insufficient documentation

## 2018-06-22 DIAGNOSIS — Q211 Atrial septal defect: Secondary | ICD-10-CM | POA: Insufficient documentation

## 2018-06-22 DIAGNOSIS — I10 Essential (primary) hypertension: Secondary | ICD-10-CM | POA: Insufficient documentation

## 2018-06-22 DIAGNOSIS — Q2112 Patent foramen ovale: Secondary | ICD-10-CM | POA: Insufficient documentation

## 2018-06-22 DIAGNOSIS — I2510 Atherosclerotic heart disease of native coronary artery without angina pectoris: Secondary | ICD-10-CM

## 2018-06-22 NOTE — Progress Notes (Signed)
Cardiology Office Note    Date:  06/23/2018   ID:  Priyansh, Pry [birth year], MRN [id number]  PCP:  Frank Frees, MD  Cardiologist:  Frank Him, MD   Chief Complaint  Patient presents with  . Dizziness  . Coronary Artery Disease  . Hyperlipidemia    History of Present Illness:  Frank Arias is a 78 y.o. male who is being seen today for the evaluation of dizziness at the request of Frank Frees, MD.  This is a 78 year old male with a history of PVD status post right CFA endarterectomy and profundoplasty by Dr. Oneida Alar in 2018 with a prior history of aortobifem with left fem-BK popliteal graft.  Also has a history of CAD post remote CABG in 2001 with LIMA to the LAD, SVG to RI, SVG to left circumflex and SVG to PDA.  He has a history of hyperlipidemia, hypertension, known PFO diagnosed by echo at [facility] in August 2018 and a remote history of syncope.    He is referred here today for complaints of episodes of dizziness by his PCP.  Past Medical History:  Diagnosis Date  . Basal cell carcinoma   . Benign localized prostatic hyperplasia with lower urinary tract symptoms (LUTS)   . CAD (coronary artery disease)    S/P CABG in 2001 with LIMA to the LAD, SVG to RI, SVG to left circumflex and SVG to PDA.  Marland Kitchen Dizzy spells   . Erectile dysfunction   . Grief reaction   . Hypercholesterolemia   . Hypertension   . Myocardial infarction ([place])   . Peripheral vascular disease (HCC)    S/P right CFA endarterectomy and profundoplasty by Dr. Oneida Alar in 2018 with a prior history of aortobifem with left fem-BK popliteal graft.  Marland Kitchen PFO (patent foramen ovale)    on 12/06/16 echo [facility])  . Syncope and collapse     Past Surgical History:  Procedure Laterality Date  . ABDOMINAL AORTIC ANEURYSM REPAIR    . CORONARY ARTERY BYPASS GRAFT  2001    Median sternotomy for coronary artery bypass grafting x 4 (left internal mammary artery to distal left  anterior descending coronary artery, saphenous vein graft to ramus intermediate branch, sequential saphenous vein graft to second circumflex marginal branch, saphenous vein graft to posterior descending coronary artery).  . DUPUYTREN CONTRACTURE RELEASE Left 2012  . ENDARTERECTOMY FEMORAL Right 12/31/2016   Procedure: RIGHT FEMORAL ENDARTERECTOMY;  Surgeon: Frank Dutch, MD;  Location: [place];  Service: Vascular;  Laterality: Right;  . KNEE ARTHROSCOPY Right 2001  . LOWER EXTREMITY ANGIOGRAPHY N/A 12/07/2016   Procedure: Lower Extremity Angiography;  Surgeon: Frank Prows, MD;  Location: [place] [facility];  Service: Cardiovascular;  Laterality: N/A;  . MULTIPLE TOOTH EXTRACTIONS    . NASAL SEPTUM SURGERY    . PATCH ANGIOPLASTY Right 12/31/2016   Procedure: PATCH PROFUNDOPLASTY, Right Femoral Profunda using Hemashield vascular patch.;  Surgeon: Frank Dutch, MD;  Location: [facility] OR;  Service: Vascular;  Laterality: Right;  . THROMBECTOMY    . TONSILLECTOMY      Current Medications: Current Meds  Medication Sig  . atorvastatin (LIPITOR) 10 MG tablet Take 10 mg by mouth daily.    Allergies:   Morphine and related   Social History   Socioeconomic History  . Marital status: Widowed    Spouse name: Not on file  . Number of children: Not on file  . Years of education: Not on file  . Highest education  level: Not on file  Occupational History  . Not on file  Social Needs  . Financial resource strain: Not on file  . Food insecurity:    Worry: Not on file    Inability: Not on file  . Transportation needs:    Medical: Not on file    Non-medical: Not on file  Tobacco Use  . Smoking status: Former Smoker    Types: Cigarettes    Last attempt to quit: 04/16/1998    Years since quitting: 20.2  . Smokeless tobacco: Never Used  Substance and Sexual Activity  . Alcohol use: Yes    Comment: occasional  . Drug use: No  . Sexual activity: Not on file  Lifestyle  . Physical activity:     Days per week: Not on file    Minutes per session: Not on file  . Stress: Not on file  Relationships  . Social connections:    Talks on phone: Not on file    Gets together: Not on file    Attends religious service: Not on file    Active member of club or organization: Not on file    Attends meetings of clubs or organizations: Not on file    Relationship status: Not on file  Other Topics Concern  . Not on file  Social History Narrative  . Not on file     Family History:  The patient's family history includes Coronary artery disease in an other family member; Lung cancer in an other family member.   ROS:   Please see the history of present illness.    ROS All other systems reviewed and are negative.  No flowsheet data found.     PHYSICAL EXAM:   VS:  BP (!) 141/77   Pulse 72   Ht 5\' 6"  (1.676 m)   Wt 169 lb 9.6 oz (76.9 kg)   BMI 27.37 kg/m   Orthostatic VS for the past 24 hrs (Last 3 readings):  BP- Lying Pulse- Lying BP- Sitting Pulse- Sitting BP- Standing at 0 minutes Pulse- Standing at 0 minutes BP- Standing at 3 minutes Pulse- Standing at 3 minutes  06/23/18 0824 123/71 60 135/70 62 134/71 71 126/76 68    GEN: Well nourished, well developed, in no acute distress  HEENT: normal  Neck: no JVD, carotid bruits, or masses Cardiac: RRR; no murmurs, rubs, or gallops,no edema.  Intact distal pulses bilaterally.  Respiratory:  clear to auscultation bilaterally, normal work of breathing GI: soft, nontender, nondistended, + BS MS: no deformity or atrophy  Skin: warm and dry, no rash Neuro:  Alert and Oriented x 3, Strength and sensation are intact Psych: euthymic mood, full affect  Wt Readings from Last 3 Encounters:  06/23/18 169 lb 9.6 oz (76.9 kg)  08/08/17 164 lb (74.4 kg)  01/31/17 167 lb (75.8 kg)      Studies/Labs Reviewed:   EKG:  EKG is ordered today.  The ekg ordered today demonstrates normal sinus rhythm at 72 bpm with occasional PVCs.  There is low  voltage in the limb leads.  Recent Labs: No results found for requested labs within last 8760 hours.   Lipid Panel No results found for: CHOL, TRIG, HDL, CHOLHDL, VLDL, LDLCALC, LDLDIRECT  Additional studies/ records that were reviewed today include:  Office notes from PCP.    ASSESSMENT:    1. Dizziness   2. Benign essential HTN   3. Hyperlipidemia LDL goal <70   4. Coronary artery disease involving native  coronary artery of native heart without angina pectoris   5. PAD (peripheral artery disease) ([place])   6. PFO (patent foramen ovale)      PLAN:  In order of problems listed above:  1.  Dizziness - he seems to have 2 types of dizziness.  He says the first occurs when he is sitting around and all of a sudden will feel a wave of heat across his body and then gets a bad taste in his mouth.  It last about 30 seconds and goes away.  This can occur if he sitting or standing.  He has had multiple episodes since January.  In February he got a different kind of dizziness while walking.  He said all of a sudden he felt like he was going to pass out.  He has not had any syncope.  He has had several of those episodes as well.  He is not on any rate slowing medications.  He denies any palpitations.  His symptoms sound like they may be an arrhythmia.  Orthostatic blood pressures done in the office today were normal.  So I recommended a 30-day event monitor to evaluate further for arrhythmias.  I will also get a 2D echocardiogram to assess for structural heart disease include assess for his PFO that apparently had in the past.  I will get a bubble study along with the echo.  2.  Hypertension -BP is well controlled on exam today.  He has not required any antihypertensive medications.  3.  Hyperlipidemia -LDL goal is less than 70.  His LDL was at goal at 61 and ALT normal on 06/18/2017.  He will continue on Lipitor 10 mg daily.    4.  ASCVD - s/p remote CABG in 2001.  He denies any anginal  symptoms.  5.  PAD -  status post right CFA endarterectomy and profundoplasty by Dr. Oneida Alar in 2018 with a prior history of aortobifem with left fem-BK popliteal graft.  Continue on statin therapy    Medication Adjustments/Labs and Tests Ordered: Current medicines are reviewed at length with the patient today.  Concerns regarding medicines are outlined above.  Medication changes, Labs and Tests ordered today are listed in the Patient Instructions below.  There are no Patient Instructions on file for this visit.   Signed, Frank Him, MD  06/23/2018 8:22 AM    [facility] [place], [address], [place]  [address] Phone: [phone]; Fax: [phone]

## 2018-06-23 ENCOUNTER — Encounter: Payer: Self-pay | Admitting: Cardiology

## 2018-06-23 ENCOUNTER — Encounter (INDEPENDENT_AMBULATORY_CARE_PROVIDER_SITE_OTHER): Payer: Self-pay

## 2018-06-23 ENCOUNTER — Ambulatory Visit: Payer: Medicare HMO | Admitting: Cardiology

## 2018-06-23 VITALS — BP 141/77 | HR 72 | Ht 66.0 in | Wt 169.6 lb

## 2018-06-23 DIAGNOSIS — E785 Hyperlipidemia, unspecified: Secondary | ICD-10-CM

## 2018-06-23 DIAGNOSIS — I2510 Atherosclerotic heart disease of native coronary artery without angina pectoris: Secondary | ICD-10-CM

## 2018-06-23 DIAGNOSIS — Q211 Atrial septal defect: Secondary | ICD-10-CM | POA: Diagnosis not present

## 2018-06-23 DIAGNOSIS — I739 Peripheral vascular disease, unspecified: Secondary | ICD-10-CM | POA: Diagnosis not present

## 2018-06-23 DIAGNOSIS — I10 Essential (primary) hypertension: Secondary | ICD-10-CM

## 2018-06-23 DIAGNOSIS — R42 Dizziness and giddiness: Secondary | ICD-10-CM

## 2018-06-23 DIAGNOSIS — Q2112 Patent foramen ovale: Secondary | ICD-10-CM

## 2018-06-23 MED ORDER — ASPIRIN EC 81 MG PO TBEC: 81.0000 mg | DELAYED_RELEASE_TABLET | Freq: Every day | ORAL | 3 refills | Status: DC

## 2018-06-23 NOTE — Patient Instructions (Signed)
Medication Instructions:  Start: Aspirin 81 mg, daily, by mouth  If you need a refill on your cardiac medications before your next appointment, please call your pharmacy.   Lab work: None If you have labs (blood work) drawn today and your tests are completely normal, you will receive your results only by: Marland Kitchen MyChart Message (if you have MyChart) OR . A paper copy in the mail If you have any lab test that is abnormal or we need to change your treatment, we will call you to review the results.  Testing/Procedures: Your physician has requested that you have an echocardiogram with bubble study. Echocardiography is a painless test that uses sound waves to create images of your heart. It provides your doctor with information about the size and shape of your heart and how well your heart's chambers and valves are working. This procedure takes approximately one hour. There are no restrictions for this procedure.  Your physician has recommended that you wear an event monitor. Event monitors are medical devices that record the heart's electrical activity. Doctors most often [place] these monitors to diagnose arrhythmias. Arrhythmias are problems with the speed or rhythm of the heartbeat. The monitor is a small, portable device. You can wear one while you do your normal daily activities. This is usually used to diagnose what is causing palpitations/syncope (passing out).  Follow-Up: At [facility], you and your health needs are our priority.  As part of our continuing mission to provide you with exceptional heart care, we have created designated Provider Care Teams.  These Care Teams include your primary Cardiologist (physician) and Advanced Practice Providers (APPs -  Physician Assistants and Nurse Practitioners) who all work together to provide you with the care you need, when you need it. You will need a follow up appointment in 1 years.  Please call our office 2 months in advance to schedule this  appointment.  You may see Dr. Radford Pax or one of the following Advanced Practice Providers on your designated Care Team:   [place], PA-C Melina Copa, PA-C . Ermalinda Barrios, PA-C

## 2018-06-24 NOTE — Addendum Note (Signed)
Addended by: Patterson Hammersmith A on: 06/24/2018 02:52 PM   Modules accepted: Orders

## 2018-07-01 ENCOUNTER — Telehealth: Payer: Self-pay | Admitting: Cardiology

## 2018-07-01 NOTE — Telephone Encounter (Signed)
New Message:    Patient calling back to get his appt back that he just cancel. Patient states that he is having some SOB. Please call patient back.

## 2018-07-03 ENCOUNTER — Other Ambulatory Visit (HOSPITAL_COMMUNITY): Payer: Medicare HMO

## 2018-07-04 ENCOUNTER — Other Ambulatory Visit (HOSPITAL_COMMUNITY): Payer: Medicare HMO

## 2018-07-17 ENCOUNTER — Telehealth: Payer: Self-pay | Admitting: Cardiovascular Disease

## 2018-07-17 NOTE — Telephone Encounter (Signed)
Patient called and discussed cancelling elective echocardiogram due to concerns for Covid19 and need for social distancing . Patient stable with no urgent symptoms.  TTE scheduled for 4/14 can be rescheduled for 3-6 months

## 2018-07-29 ENCOUNTER — Other Ambulatory Visit (HOSPITAL_COMMUNITY): Payer: Medicare HMO

## 2018-08-11 ENCOUNTER — Telehealth: Payer: Self-pay | Admitting: Cardiology

## 2018-08-11 NOTE — Telephone Encounter (Signed)
Follow Up:; ° ° °Returning your call. °

## 2018-08-11 NOTE — Telephone Encounter (Signed)
Patient is calling stating he needs to have his Echo study done.  He has had two episodes where is was dizzy and fell.  He states he doesn't feel SOB or chest pain.  He said he can feel it come he feels like heat come over home.

## 2018-08-11 NOTE — Telephone Encounter (Signed)
Patient is aware, message sent to scheduling.

## 2018-08-11 NOTE — Telephone Encounter (Signed)
Attempted, no voicemail option.

## 2018-08-11 NOTE — Telephone Encounter (Signed)
Spoke with the patient, he stated he has been having dizzy spells. He states he can be walking around and get dizzy and fall. He also said it occurs while position change, but not limited to. He says he feels "hot in his head" and dizzy. He denies chest pain, SOB and syncope. He does not have a blood pressure cuff, I advised to pick up one and check his blood pressure. The patient also does not drink water at all. He drinks some milk and beer. I advised the patient that he needs to at least drink 8 glasses of water a day. He is going to get some water and start increasing his intake. He hates water, but I advised adding some fruit to flavor. He called his PCP about this falling today, but has not heard back yet.

## 2018-08-11 NOTE — Telephone Encounter (Signed)
He was supposed to wear a heart monitor so please find out if that has been ordered.  He needs to stop drinking beer altogether and drink at least 8 glasses of water, juice or gatorade a day

## 2018-08-12 ENCOUNTER — Telehealth: Payer: Self-pay

## 2018-08-12 NOTE — Telephone Encounter (Signed)
Pt called the office stating he was having issues with dizziness. He said that he needed to get a scan done of his brain and wanted to know what we could do to help.   Called pt and told him that he needed to contact his PCP or his Cardiologist who he was referred to for his dizziness. He just saw them in March and they were ordering several test which it does not appear he has been scheduled for.   Pt verbalized understanding and number given to the cardiology office.   York Cerise, CMA

## 2018-08-14 ENCOUNTER — Telehealth: Payer: Self-pay | Admitting: *Deleted

## 2018-08-14 NOTE — Telephone Encounter (Signed)
Informed patient has been enrolled for [employer] to ship a 30 day cardiac event monitor to the home.  [employer] will call today tonfirm shipping address and then send 2nd day air [employer].  Instructions reviewed briefly as they are included in kit.  Patients daughter, Crist Infante, asked to be listed as primary and emergency contact at [phone] as her father sometimes does not understand what it being expalined.

## 2018-08-21 ENCOUNTER — Ambulatory Visit (INDEPENDENT_AMBULATORY_CARE_PROVIDER_SITE_OTHER): Payer: Medicare HMO

## 2018-08-21 DIAGNOSIS — R42 Dizziness and giddiness: Secondary | ICD-10-CM

## 2018-09-17 ENCOUNTER — Other Ambulatory Visit: Payer: Self-pay

## 2018-09-24 ENCOUNTER — Telehealth: Payer: Self-pay

## 2018-09-24 NOTE — Telephone Encounter (Signed)
-----   Message from Sueanne Margarita, MD sent at 09/19/2018 10:40 AM EDT ----- Please let patient know that heart monitor showed several episodes of a fast heart rhythm from the bottom of his heart.  Please get a Lexiscan myoview to rule out ischemia and 2D echo to assess LVF

## 2018-09-24 NOTE — Telephone Encounter (Signed)
We needs to do both - the echo only shows heart function and the stress test is needed to assess if there are blockages in his arteries that can cause ventricular arrhythmias

## 2018-09-24 NOTE — Telephone Encounter (Signed)
Spoke with the patient, he requested I call the daughter and speak with her. The patient did not have any dizziness spells during the monitor, but last Saturday, after the monitor was complete, he had a dizzy spell every 30 minutes. However, once he started to drink beer he stopped having problems. He has stated he drinks 1 beer every few days and he has not had any problems since. The daughter agreed to go ahead with the echo, but wanted to wait on the [place] until the echo was complete.

## 2018-09-25 NOTE — Telephone Encounter (Signed)
Attempted daughter, will call later.

## 2018-10-01 NOTE — Telephone Encounter (Signed)
Left message for daughter to call back.  

## 2018-10-06 NOTE — Telephone Encounter (Signed)
Left message for the daughter to call back, per patient request.

## 2018-10-08 ENCOUNTER — Telehealth (HOSPITAL_COMMUNITY): Payer: Self-pay | Admitting: Radiology

## 2018-10-08 NOTE — Telephone Encounter (Signed)
Left message to call office-Patient needs to schedule an echocardiogram.  

## 2018-10-14 NOTE — Telephone Encounter (Signed)
Scheduled echo with bubble on 10/28/18.

## 2018-10-28 ENCOUNTER — Other Ambulatory Visit: Payer: Self-pay

## 2018-10-28 ENCOUNTER — Ambulatory Visit (HOSPITAL_COMMUNITY): Payer: Medicare HMO | Attending: Cardiology

## 2018-10-28 DIAGNOSIS — Q2112 Patent foramen ovale: Secondary | ICD-10-CM

## 2018-10-28 DIAGNOSIS — Q211 Atrial septal defect: Secondary | ICD-10-CM | POA: Diagnosis not present

## 2018-10-28 DIAGNOSIS — R42 Dizziness and giddiness: Secondary | ICD-10-CM

## 2018-11-19 ENCOUNTER — Other Ambulatory Visit: Payer: Self-pay

## 2018-11-19 DIAGNOSIS — I739 Peripheral vascular disease, unspecified: Secondary | ICD-10-CM

## 2018-11-20 ENCOUNTER — Other Ambulatory Visit: Payer: Self-pay

## 2018-11-20 ENCOUNTER — Ambulatory Visit (INDEPENDENT_AMBULATORY_CARE_PROVIDER_SITE_OTHER): Payer: Medicare HMO | Admitting: Family

## 2018-11-20 ENCOUNTER — Inpatient Hospital Stay (HOSPITAL_COMMUNITY): Payer: Medicare HMO

## 2018-11-20 ENCOUNTER — Ambulatory Visit (INDEPENDENT_AMBULATORY_CARE_PROVIDER_SITE_OTHER)
Admission: RE | Admit: 2018-11-20 | Discharge: 2018-11-20 | Disposition: A | Discharge status: Home / Self Care | Payer: Medicare HMO | Source: Ambulatory Visit | Attending: Family | Admitting: Family

## 2018-11-20 ENCOUNTER — Inpatient Hospital Stay (HOSPITAL_COMMUNITY)
Admission: AD | Admit: 2018-11-20 | Discharge: 2018-11-26 | DRG: 271 | Disposition: A | Discharge status: Home / Self Care | Payer: Medicare HMO | Attending: Vascular Surgery | Admitting: Vascular Surgery

## 2018-11-20 ENCOUNTER — Encounter: Payer: Self-pay | Admitting: Family

## 2018-11-20 VITALS — BP 144/80 | HR 68 | Temp 97.9°F | Resp 14 | Ht 66.0 in | Wt 170.0 lb

## 2018-11-20 DIAGNOSIS — I480 Paroxysmal atrial fibrillation: Secondary | ICD-10-CM | POA: Diagnosis not present

## 2018-11-20 DIAGNOSIS — I7410 Embolism and thrombosis of unspecified parts of aorta: Secondary | ICD-10-CM

## 2018-11-20 DIAGNOSIS — I340 Nonrheumatic mitral (valve) insufficiency: Secondary | ICD-10-CM | POA: Diagnosis not present

## 2018-11-20 DIAGNOSIS — Z79899 Other long term (current) drug therapy: Secondary | ICD-10-CM | POA: Diagnosis not present

## 2018-11-20 DIAGNOSIS — R000 Tachycardia, unspecified: Secondary | ICD-10-CM

## 2018-11-20 DIAGNOSIS — Z01810 Encounter for preprocedural cardiovascular examination: Secondary | ICD-10-CM | POA: Diagnosis not present

## 2018-11-20 DIAGNOSIS — Z87891 Personal history of nicotine dependence: Secondary | ICD-10-CM

## 2018-11-20 DIAGNOSIS — H9190 Unspecified hearing loss, unspecified ear: Secondary | ICD-10-CM | POA: Diagnosis present

## 2018-11-20 DIAGNOSIS — I998 Other disorder of circulatory system: Secondary | ICD-10-CM | POA: Diagnosis present

## 2018-11-20 DIAGNOSIS — J9811 Atelectasis: Secondary | ICD-10-CM | POA: Diagnosis not present

## 2018-11-20 DIAGNOSIS — I5022 Chronic systolic (congestive) heart failure: Secondary | ICD-10-CM | POA: Diagnosis present

## 2018-11-20 DIAGNOSIS — Z951 Presence of aortocoronary bypass graft: Secondary | ICD-10-CM

## 2018-11-20 DIAGNOSIS — Z801 Family history of malignant neoplasm of trachea, bronchus and lung: Secondary | ICD-10-CM | POA: Diagnosis not present

## 2018-11-20 DIAGNOSIS — I70203 Unspecified atherosclerosis of native arteries of extremities, bilateral legs: Secondary | ICD-10-CM | POA: Diagnosis present

## 2018-11-20 DIAGNOSIS — E7800 Pure hypercholesterolemia, unspecified: Secondary | ICD-10-CM

## 2018-11-20 DIAGNOSIS — Z20828 Contact with and (suspected) exposure to other viral communicable diseases: Secondary | ICD-10-CM | POA: Diagnosis present

## 2018-11-20 DIAGNOSIS — I70229 Atherosclerosis of native arteries of extremities with rest pain, unspecified extremity: Secondary | ICD-10-CM

## 2018-11-20 DIAGNOSIS — I110 Hypertensive heart disease with heart failure: Secondary | ICD-10-CM | POA: Diagnosis present

## 2018-11-20 DIAGNOSIS — I2510 Atherosclerotic heart disease of native coronary artery without angina pectoris: Secondary | ICD-10-CM

## 2018-11-20 DIAGNOSIS — I10 Essential (primary) hypertension: Secondary | ICD-10-CM | POA: Diagnosis not present

## 2018-11-20 DIAGNOSIS — I2583 Coronary atherosclerosis due to lipid rich plaque: Secondary | ICD-10-CM

## 2018-11-20 DIAGNOSIS — Z85828 Personal history of other malignant neoplasm of skin: Secondary | ICD-10-CM | POA: Diagnosis not present

## 2018-11-20 DIAGNOSIS — I739 Peripheral vascular disease, unspecified: Secondary | ICD-10-CM

## 2018-11-20 DIAGNOSIS — I743 Embolism and thrombosis of arteries of the lower extremities: Secondary | ICD-10-CM | POA: Diagnosis not present

## 2018-11-20 DIAGNOSIS — Z959 Presence of cardiac and vascular implant and graft, unspecified: Secondary | ICD-10-CM

## 2018-11-20 DIAGNOSIS — I361 Nonrheumatic tricuspid (valve) insufficiency: Secondary | ICD-10-CM | POA: Diagnosis not present

## 2018-11-20 DIAGNOSIS — Z09 Encounter for follow-up examination after completed treatment for conditions other than malignant neoplasm: Secondary | ICD-10-CM

## 2018-11-20 DIAGNOSIS — Z7982 Long term (current) use of aspirin: Secondary | ICD-10-CM | POA: Diagnosis not present

## 2018-11-20 DIAGNOSIS — I70222 Atherosclerosis of native arteries of extremities with rest pain, left leg: Secondary | ICD-10-CM | POA: Diagnosis not present

## 2018-11-20 DIAGNOSIS — Z885 Allergy status to narcotic agent status: Secondary | ICD-10-CM

## 2018-11-20 DIAGNOSIS — I70212 Atherosclerosis of native arteries of extremities with intermittent claudication, left leg: Secondary | ICD-10-CM | POA: Diagnosis not present

## 2018-11-20 DIAGNOSIS — D649 Anemia, unspecified: Secondary | ICD-10-CM | POA: Diagnosis present

## 2018-11-20 DIAGNOSIS — I252 Old myocardial infarction: Secondary | ICD-10-CM

## 2018-11-20 DIAGNOSIS — N400 Enlarged prostate without lower urinary tract symptoms: Secondary | ICD-10-CM | POA: Diagnosis present

## 2018-11-20 DIAGNOSIS — E785 Hyperlipidemia, unspecified: Secondary | ICD-10-CM | POA: Diagnosis present

## 2018-11-20 DIAGNOSIS — I700 Atherosclerosis of aorta: Secondary | ICD-10-CM

## 2018-11-20 DIAGNOSIS — I70612 Atherosclerosis of nonbiological bypass graft(s) of the extremities with intermittent claudication, left leg: Principal | ICD-10-CM | POA: Diagnosis present

## 2018-11-20 DIAGNOSIS — T82868A Thrombosis of vascular prosthetic devices, implants and grafts, initial encounter: Secondary | ICD-10-CM | POA: Diagnosis not present

## 2018-11-20 DIAGNOSIS — Z8679 Personal history of other diseases of the circulatory system: Secondary | ICD-10-CM

## 2018-11-20 DIAGNOSIS — Z8249 Family history of ischemic heart disease and other diseases of the circulatory system: Secondary | ICD-10-CM

## 2018-11-20 DIAGNOSIS — Q211 Atrial septal defect: Secondary | ICD-10-CM | POA: Diagnosis not present

## 2018-11-20 LAB — CBC
HCT: 40.7 % (ref 39.0–52.0)
HCT: 39.7 % (ref 39.0–52.0)
Hemoglobin: 13.4 g/dL (ref 13.0–17.0)
Hemoglobin: 12.8 g/dL — ABNORMAL LOW (ref 13.0–17.0)
MCH: 29.5 pg (ref 26.0–34.0)
MCH: 28.9 pg (ref 26.0–34.0)
MCHC: 32.9 g/dL (ref 30.0–36.0)
MCHC: 32.2 g/dL (ref 30.0–36.0)
MCV: 89.6 fL (ref 80.0–100.0)
MCV: 89.6 fL (ref 80.0–100.0)
Platelets: 171 10*3/uL (ref 150–400)
Platelets: 162 10*3/uL (ref 150–400)
RBC: 4.54 MIL/uL (ref 4.22–5.81)
RBC: 4.43 MIL/uL (ref 4.22–5.81)
RDW: 13.3 % (ref 11.5–15.5)
RDW: 13.3 % (ref 11.5–15.5)
WBC: 6.3 10*3/uL (ref 4.0–10.5)
WBC: 6.9 10*3/uL (ref 4.0–10.5)
nRBC: 0 % (ref 0.0–0.2)
nRBC: 0 % (ref 0.0–0.2)

## 2018-11-20 LAB — BASIC METABOLIC PANEL
Anion gap: 10 (ref 5–15)
BUN: 23 mg/dL (ref 8–23)
CO2: 25 mmol/L (ref 22–32)
Calcium: 9.3 mg/dL (ref 8.9–10.3)
Chloride: 104 mmol/L (ref 98–111)
Creatinine, Ser: 1 mg/dL (ref 0.61–1.24)
GFR calc Af Amer: >60 mL/min (ref >60)
GFR calc non Af Amer: >60 mL/min (ref >60)
Glucose, Bld: 100 mg/dL — ABNORMAL HIGH (ref 70–99)
Potassium: 4 mmol/L (ref 3.5–5.1)
Sodium: 139 mmol/L (ref 135–145)

## 2018-11-20 LAB — COMPREHENSIVE METABOLIC PANEL
ALT: 20 U/L (ref 0–44)
AST: 31 U/L (ref 15–41)
Albumin: 3.7 g/dL (ref 3.5–5.0)
Alkaline Phosphatase: 110 U/L (ref 38–126)
Anion gap: 10 (ref 5–15)
BUN: 21 mg/dL (ref 8–23)
CO2: 24 mmol/L (ref 22–32)
Calcium: 9 mg/dL (ref 8.9–10.3)
Chloride: 105 mmol/L (ref 98–111)
Creatinine, Ser: 1.06 mg/dL (ref 0.61–1.24)
GFR calc Af Amer: >60 mL/min (ref >60)
GFR calc non Af Amer: >60 mL/min (ref >60)
Glucose, Bld: 94 mg/dL (ref 70–99)
Potassium: 4 mmol/L (ref 3.5–5.1)
Sodium: 139 mmol/L (ref 135–145)
Total Bilirubin: 0.3 mg/dL (ref 0.3–1.2)
Total Protein: 7.5 g/dL (ref 6.5–8.1)

## 2018-11-20 LAB — PROTIME-INR
INR: 1.3 — ABNORMAL HIGH (ref 0.8–1.2)
Prothrombin Time: 15.7 seconds — ABNORMAL HIGH (ref 11.4–15.2)

## 2018-11-20 LAB — SARS CORONAVIRUS 2 BY RT PCR (HOSPITAL ORDER, PERFORMED IN ~~LOC~~ HOSPITAL LAB): SARS Coronavirus 2: NEGATIVE

## 2018-11-20 LAB — HEPARIN LEVEL (UNFRACTIONATED): Heparin Unfractionated: 0.49 IU/mL (ref 0.30–0.70)

## 2018-11-20 MED ORDER — HEPARIN BOLUS VIA INFUSION
3900.0000 [IU] | Freq: Once | INTRAVENOUS | Status: AC
  Administered 2018-11-20: 3900 [IU] via INTRAVENOUS
  Filled 2018-11-20: qty 3900

## 2018-11-20 MED ORDER — HEPARIN (PORCINE) 25000 UT/250ML-% IV SOLN
1250.0000 [IU]/h | INTRAVENOUS | Status: DC
  Administered 2018-11-20 – 2018-11-22 (×3): 1250 [IU]/h via INTRAVENOUS
  Filled 2018-11-20 (×3): qty 250

## 2018-11-20 MED ORDER — IOHEXOL 350 MG/ML SOLN
100.0000 mL | Freq: Once | INTRAVENOUS | Status: AC | PRN
  Administered 2018-11-20: 100 mL via INTRAVENOUS

## 2018-11-20 MED ORDER — ACETAMINOPHEN 325 MG PO TABS
325.0000 mg | ORAL_TABLET | ORAL | Status: DC | PRN
  Filled 2018-11-20: qty 2

## 2018-11-20 MED ORDER — POTASSIUM CHLORIDE CRYS ER 20 MEQ PO TBCR
20.0000 meq | EXTENDED_RELEASE_TABLET | Freq: Once | ORAL | Status: AC
  Administered 2018-11-20: 20 meq via ORAL
  Filled 2018-11-20: qty 1

## 2018-11-20 MED ORDER — ACETAMINOPHEN 325 MG RE SUPP: 325.0000 mg | RECTAL | Status: DC | PRN

## 2018-11-20 MED ORDER — METOPROLOL TARTRATE 5 MG/5ML IV SOLN
2.0000 mg | INTRAVENOUS | Status: DC | PRN
  Administered 2018-11-22: 5 mg via INTRAVENOUS
  Filled 2018-11-20: qty 5

## 2018-11-20 MED ORDER — LABETALOL HCL 5 MG/ML IV SOLN: 10.0000 mg | INTRAVENOUS | Status: DC | PRN

## 2018-11-20 MED ORDER — BISACODYL 5 MG PO TBEC: 5.0000 mg | DELAYED_RELEASE_TABLET | Freq: Every day | ORAL | Status: DC | PRN

## 2018-11-20 MED ORDER — GUAIFENESIN-DM 100-10 MG/5ML PO SYRP: 15.0000 mL | ORAL_SOLUTION | ORAL | Status: DC | PRN

## 2018-11-20 MED ORDER — OXYCODONE-ACETAMINOPHEN 5-325 MG PO TABS
1.0000 | ORAL_TABLET | ORAL | Status: DC | PRN
  Administered 2018-11-21 – 2018-11-23 (×3): 2 via ORAL
  Filled 2018-11-20 (×2): qty 1
  Filled 2018-11-20 (×2): qty 2

## 2018-11-20 MED ORDER — DIPHENHYDRAMINE HCL 25 MG PO CAPS: 25.0000 mg | ORAL_CAPSULE | Freq: Three times a day (TID) | ORAL | Status: DC | PRN

## 2018-11-20 MED ORDER — PHENOL 1.4 % MT LIQD: 1.0000 | OROMUCOSAL | Status: DC | PRN

## 2018-11-20 MED ORDER — SODIUM CHLORIDE 0.9 % IV SOLN
INTRAVENOUS | Status: DC
  Administered 2018-11-20 – 2018-11-23 (×3): via INTRAVENOUS

## 2018-11-20 MED ORDER — ONDANSETRON HCL 4 MG/2ML IJ SOLN: 4.0000 mg | Freq: Four times a day (QID) | INTRAMUSCULAR | Status: DC | PRN

## 2018-11-20 MED ORDER — SENNOSIDES-DOCUSATE SODIUM 8.6-50 MG PO TABS: 1.0000 | ORAL_TABLET | Freq: Every evening | ORAL | Status: DC | PRN

## 2018-11-20 MED ORDER — HYDROMORPHONE HCL 1 MG/ML IJ SOLN: 0.5000 mg | INTRAMUSCULAR | Status: DC | PRN

## 2018-11-20 MED ORDER — ALUM & MAG HYDROXIDE-SIMETH 200-200-20 MG/5ML PO SUSP: 15.0000 mL | ORAL | Status: DC | PRN

## 2018-11-20 MED ORDER — HYDRALAZINE HCL 20 MG/ML IJ SOLN: 5.0000 mg | INTRAMUSCULAR | Status: DC | PRN

## 2018-11-20 MED ORDER — DOCUSATE SODIUM 100 MG PO CAPS
100.0000 mg | ORAL_CAPSULE | Freq: Two times a day (BID) | ORAL | Status: DC
  Administered 2018-11-21: 100 mg via ORAL
  Filled 2018-11-20 (×6): qty 1

## 2018-11-20 MED ORDER — PANTOPRAZOLE SODIUM 40 MG PO TBEC
40.0000 mg | DELAYED_RELEASE_TABLET | Freq: Every day | ORAL | Status: DC
Start: 2018-11-20 — End: 2018-11-24
  Administered 2018-11-20 – 2018-11-21 (×2): 40 mg via ORAL
  Filled 2018-11-20 (×4): qty 1

## 2018-11-20 NOTE — H&P (Signed)
[facility] [place]   CC: Follow up peripheral artery occlusive disease  History of Present Illness Frank Arias is a 78 y.o. male who is s/p right CFA endarterectomy with profundaplasty by Dr. Oneida Alar 12/31/2016 who is here for routine follow-up.  He has a history of aortobifem and left fem-BK popliteal graft.  He was last evaluated by Dr. Oneida Alar on 08-08-17. At that time  ABIs were normal on the left and a reasonable ABI on the right side that was asymptomatic. He was to follow-up in 1 year with ABIs and see our nurse practitioner.  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 Prior to this his left calf would cramp after he walked too far. Xanax is helping the soreness in his left calf. His right calf also bothers him since he crawled under the house a week ago.   He has a remote hx of a CABG, and states he has not seen a cardiologist in over 20 years.   He denies chest pain or dyspnea.  He denies feeling anxious.     Diabetic: No Tobacco use: former smoker, quit in 2000  Pt meds include: Statin :Yes Betablocker: No ASA: Yes Other anticoagulants/antiplatelets: no       Past Medical History:  Diagnosis Date  . Basal cell carcinoma   . Benign localized prostatic hyperplasia with lower urinary tract symptoms (LUTS)   . CAD (coronary artery disease)    S/P CABG in 2001 with LIMA to the LAD, SVG to RI, SVG to left circumflex and SVG to PDA.  Marland Kitchen Dizzy spells   . Erectile dysfunction   . Grief reaction   . Hypercholesterolemia   . Hypertension   . Myocardial infarction ([place])   . Peripheral vascular disease (HCC)    S/P right CFA endarterectomy and profundoplasty by Dr. Oneida Alar in 2018 with a prior history of aortobifem with left fem-BK popliteal graft.  Marland Kitchen PFO (patent foramen ovale)    on 12/06/16 echo [facility])  . Syncope and collapse     Social History Social History         Tobacco Use  . Smoking status: Former Smoker    Types: Cigarettes    Quit date: 04/16/1998    Years since quitting: 20.6  . Smokeless tobacco: Never Used  Substance Use Topics  . Alcohol use: Yes    Comment: occasional  . Drug use: No    Family History      Family History  Problem Relation Age of Onset  . Coronary artery disease Other   . Lung cancer Other          Past Surgical History:  Procedure Laterality Date  . ABDOMINAL AORTIC ANEURYSM REPAIR    . CORONARY ARTERY BYPASS GRAFT  2001    Median sternotomy for coronary artery bypass grafting x 4 (left internal mammary artery to distal left anterior descending coronary artery, saphenous vein graft to ramus intermediate branch, sequential saphenous vein graft to second circumflex marginal branch, saphenous vein graft to posterior descending coronary artery).  . DUPUYTREN CONTRACTURE RELEASE Left 2012  . ENDARTERECTOMY FEMORAL Right 12/31/2016   Procedure: RIGHT FEMORAL ENDARTERECTOMY;  Surgeon: Elam Dutch, MD;  Location: [place];  Service: Vascular;  Laterality: Right;  . KNEE ARTHROSCOPY Right 2001  . LOWER EXTREMITY ANGIOGRAPHY N/A 12/07/2016   Procedure: Lower Extremity Angiography;  Surgeon: Adrian Prows, MD;  Location: [place] [facility];  Service: Cardiovascular;  Laterality: N/A;  . MULTIPLE TOOTH EXTRACTIONS    . NASAL SEPTUM SURGERY    . PATCH ANGIOPLASTY Right 12/31/2016   Procedure: PATCH PROFUNDOPLASTY, Right Femoral Profunda using Hemashield vascular patch.;  Surgeon: Elam Dutch, MD;  Location: [facility] OR;  Service: Vascular;  Laterality: Right;  . THROMBECTOMY    . TONSILLECTOMY           Allergies  Allergen Reactions  . Morphine And Related Other (See Comments)    CONFUSED          Current Outpatient Medications  Medication Sig Dispense Refill  . aspirin EC 81 MG tablet Take 1 tablet (81 mg total) by mouth daily. 90 tablet 3  . atorvastatin (LIPITOR) 10 MG tablet Take  10 mg by mouth daily.     No current facility-administered medications for this visit.     ROS: See HPI for pertinent positives and negatives.   Physical Examination     Vitals:   11/20/18 1142  BP: (!) 144/80  Resp: 14  Temp: 97.9 F (36.6 C)  TempSrc: Temporal  SpO2: 97%  Weight: 170 lb (77.1 kg)  Height: 5\' 6"  (1.676 m)   Body mass index is 27.44 kg/m.   His heart rate is 140/minute  General: A&O x 3, WDWN, male in NAD. Gait: normal HENT: No gross abnormalities.  Eyes: PERRLA. Pulmonary: Respirations are non labored, CTAB, good air movement in all fields Cardiac: Tachycardic at 140/minute, no detected murmur.   Old CABG scar noted.       Carotid Bruits Right Left   Negative Negative   Radial pulses are 2+ palpable bilaterally   Adominal aortic pulse is not palpable                         VASCULAR EXAM: Extremities with ischemic changes: left toes are dusky, without Gangrene; without open wounds.                                                                                                                                                       LE Pulses Right Left       FEMORAL  3+ palpable  not palpable        POPLITEAL  not palpable   not palpable       POSTERIOR TIBIAL  not palpable   not palpable        DORSALIS PEDIS      ANTERIOR TIBIAL not palpable  not palpable    Abdomen: soft, NT, no palpable masses.  Skin: no rashes, no cellulitis, no ulcers noted. Musculoskeletal: no muscle wasting or atrophy.      Neurologic: A&O X 3; appropriate affect, Sensation is normal; MOTOR FUNCTION:  moving all extremities equally, motor strength 5/5 throughout. Speech is fluent/normal. CN 2-12  intact except is hard of hearing. Psychiatric: Thought content is normal, mood appropriate for clinical situation.     ASSESSMENT: Frank Arias is a 78 y.o. male who presents with: dusky left toes, left calf cramping worse for a week, right calf  cramps with walking also.  ABI's today show no blood flow to his left ankle, left femoral pulse is not palpable.  Right ABI  He is tachycardic at 140/minute, denies chest pain or dyspnea, denies feeling light headed.    Normal serum creatinine of 0.99 on 01-01-17 (most recent).  8 am was last time he ate today.   Dr. Oneida Alar spoke with and examined pt. Will admit to VVS at [facility], [place] will write orders for CTA abd/pelvis with bilateral LE run off.  Need cardiology consult.   DATA  ABI (Date: 11/20/2018): ABI Findings: +---------+------------------+-----+-------------------+--------+ Right Rt Pressure (mmHg)IndexWaveform Comment  +---------+------------------+-----+-------------------+--------+ Brachial 135     +---------+------------------+-----+-------------------+--------+ PTA 91 0.67 dampened monophasic  +---------+------------------+-----+-------------------+--------+ DP 94 0.70 dampened monophasic  +---------+------------------+-----+-------------------+--------+ Great Toe59 0.44 Abnormal   +---------+------------------+-----+-------------------+--------+  +---------+------------------+-----+--------+------------+ Left Lt Pressure (mmHg)IndexWaveformComment  +---------+------------------+-----+--------+------------+ Brachial 135     +---------+------------------+-----+--------+------------+ PTA   absent undetectable +---------+------------------+-----+--------+------------+ DP   absent undetectable +---------+------------------+-----+--------+------------+ Great Toe  absent  undetectable +---------+------------------+-----+--------+------------+  +-------+------------+------------+------------+------------+ ABI/TBIToday's ABI Today's TBI Previous ABIPrevious TBI +-------+------------+------------+------------+------------+ Right 0.70 0.44 0.56 0.37  +-------+------------+------------+------------+------------+ Left undetectableundetectable1.03 0.70  +-------+------------+------------+------------+------------+  Right ABIs and TBIs appear increased compared to prior study on 08/08/2017. Left ABIs and TBIs appear decreased compared to prior study on 08/08/2017.  Summary: Right: Resting right ankle-brachial index indicates moderate right lower extremity arterial disease. The right toe-brachial index is abnormal. RT great toe pressure = 59 mmHg. PPG tracings appear dampened.  Left: Signals were undetectable in the left ankle.    PLAN:  Dr. Oneida Alar spoke with and examined pt. Will admit to VVS at [facility] [place] will write orders for CTA abd/pelvis with bilateral LE run off.  Need cardiology consult.    Clemon Chambers, RN, MSN, FNP-C [facility] Phone: [phone]  Clinic MD: Laqueta Due  11/20/18 11:56 AM

## 2018-11-20 NOTE — Progress Notes (Signed)
ANTICOAGULATION CONSULT NOTE - Follow Up Consult  Pharmacy Consult for heparin Indication: occluded aorto L fem-BK pop bypass with claudication  Labs: Recent Labs    11/20/18 1647 11/20/18 2243  HGB 13.4 12.8*  HCT 40.7 39.7  PLT 171 162  LABPROT  --  15.7*  INR  --  1.3*  HEPARINUNFRC  --  0.49  CREATININE 1.00 1.06    Assessment/Plan:  78yo male therapeutic on heparin with initial dosing for VTE. Will continue gtt at current rate and confirm stable with am labs.   Wynona Neat, PharmD, BCPS  11/20/2018,11:48 PM

## 2018-11-20 NOTE — Progress Notes (Addendum)
ANTICOAGULATION CONSULT NOTE - Initial Consult  Pharmacy Consult for Heparin Indication: VTE Treatment, Occluded Aorto L Fem-BK Pop Bypass with Claudication     Allergies  Allergen Reactions  . Morphine And Related Other (See Comments)    CONFUSED    Patient Measurements:  Wt: 77.1 kg (11/20/18) Ht: 66 inches (11/20/18) Heparin Dosing Weight: 77.1 kg  Vital Signs: Temp: 98.2 F (36.8 C) (08/06 1552) Temp Source: Oral (08/06 1552) BP: 144/80 (08/06 1142) Pulse Rate: 68 (08/06 1142)  Labs: Scr pending  Medical History: Past Medical History:  Diagnosis Date  . Basal cell carcinoma   . Benign localized prostatic hyperplasia with lower urinary tract symptoms (LUTS)   . CAD (coronary artery disease)    S/P CABG in 2001 with LIMA to the LAD, SVG to RI, SVG to left circumflex and SVG to PDA.  Marland Kitchen Dizzy spells   . Erectile dysfunction   . Grief reaction   . Hypercholesterolemia   . Hypertension   . Myocardial infarction ([place])   . Peripheral vascular disease (HCC)    S/P right CFA endarterectomy and profundoplasty by Dr. Oneida Alar in 2018 with a prior history of aortobifem with left fem-BK popliteal graft.  Marland Kitchen PFO (patent foramen ovale)    on 12/06/16 echo [facility])  . Syncope and collapse     Assessment: 78 yr old man with hx of CAD (S/P CABG 2001), dizziness, hyperlipidemia, hypertension, known PFO (dx 2018), PVD (S/P R CFA endarterectomy and profundoplasty in 2018), remote hx AAA repair, LLE femoral bypass in 2001. Pt presented to Vascular Clinic today with complaint of L calf pain and L calf cramping, which is worse over past week. ABIs were checked in office, with L ABI signals undetected. Pt has dusky L toes on admission.  Patient's only home medications are aspirin and Lipitor.  Scr pending (most recent in Epic is from 12/2016, which was 0.99)  CBC pending  Goal of Therapy:  Heparin level 0.3-0.7 units/ml Monitor platelets by anticoagulation protocol: Yes    Plan:  Heparin 3900 units IV X 1, followed by heparin infusion at 1250 units/hr Check 8-hr heparin level Monitor heparin level, CBC daily Monitor for signs/symptoms of bleeding  Gillermina Hu, PharmD, BCPS, [facility] Clinical Pharmacist 11/20/2018,4:28 PM

## 2018-11-20 NOTE — H&P (View-Only) (Signed)
[facility] [place]   CC: Follow up peripheral artery occlusive disease  History of Present Illness Frank Arias is a 78 y.o. male who is s/p right CFA endarterectomy with profundaplasty by Dr. Oneida Alar 12/31/2016 who is here for routine follow-up.   He has a history of aortobifem and left fem-BK popliteal graft.  He was last evaluated by Dr. Oneida Alar on 08-08-17. At that time  ABIs were normal on the left and a reasonable ABI on the right side that was asymptomatic.  He was to follow-up in 1 year with ABIs and see our nurse practitioner.  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 Prior to this his left calf would cramp after he walked too far. Xanax is helping the soreness in his left calf. His right calf also bothers him since he crawled under the house a week ago.   He has a remote hx of a CABG, and states he has not seen a cardiologist in over 20 years.   He denies chest pain or dyspnea.  He denies feeling anxious.     Diabetic: No Tobacco use: former smoker, quit in 2000  Pt meds include: Statin :Yes Betablocker: No ASA: Yes Other anticoagulants/antiplatelets: no   Past Medical History:  Diagnosis Date  . Basal cell carcinoma   . Benign localized prostatic hyperplasia with lower urinary tract symptoms (LUTS)   . CAD (coronary artery disease)    S/P CABG in 2001 with LIMA to the LAD, SVG to RI, SVG to left circumflex and SVG to PDA.  Marland Kitchen Dizzy spells   . Erectile dysfunction   . Grief reaction   . Hypercholesterolemia   . Hypertension   . Myocardial infarction ([place])   . Peripheral vascular disease (HCC)    S/P right CFA endarterectomy and profundoplasty by Dr. Oneida Alar in 2018 with a prior history of aortobifem with left fem-BK popliteal graft.  Marland Kitchen PFO (patent foramen ovale)    on 12/06/16 echo [facility])  . Syncope and collapse     Social History Social History   Tobacco Use  . Smoking status: Former  Smoker    Types: Cigarettes    Quit date: 04/16/1998    Years since quitting: 20.6  . Smokeless tobacco: Never Used  Substance Use Topics  . Alcohol use: Yes    Comment: occasional  . Drug use: No    Family History Family History  Problem Relation Age of Onset  . Coronary artery disease Other   . Lung cancer Other     Past Surgical History:  Procedure Laterality Date  . ABDOMINAL AORTIC ANEURYSM REPAIR    . CORONARY ARTERY BYPASS GRAFT  2001    Median sternotomy for coronary artery bypass grafting x 4 (left internal mammary artery to distal left anterior descending coronary artery, saphenous vein graft to Arias intermediate branch, sequential saphenous vein graft to second circumflex marginal branch, saphenous vein graft to posterior descending coronary artery).  . DUPUYTREN CONTRACTURE RELEASE Left 2012  . ENDARTERECTOMY FEMORAL Right 12/31/2016   Procedure: RIGHT FEMORAL ENDARTERECTOMY;  Surgeon: Elam Dutch, MD;  Location: [place];  Service: Vascular;  Laterality: Right;  . KNEE ARTHROSCOPY Right 2001  . LOWER EXTREMITY ANGIOGRAPHY N/A 12/07/2016   Procedure: Lower Extremity Angiography;  Surgeon: Adrian Prows, MD;  Location: [place] [facility];  Service: Cardiovascular;  Laterality: N/A;  . MULTIPLE TOOTH EXTRACTIONS    . NASAL SEPTUM SURGERY    .  PATCH ANGIOPLASTY Right 12/31/2016   Procedure: PATCH PROFUNDOPLASTY, Right Femoral Profunda using Hemashield vascular patch.;  Surgeon: Elam Dutch, MD;  Location: [facility];  Service: Vascular;  Laterality: Right;  . THROMBECTOMY    . TONSILLECTOMY      Allergies  Allergen Reactions  . Morphine And Related Other (See Comments)    CONFUSED    Current Outpatient Medications  Medication Sig Dispense Refill  . aspirin EC 81 MG tablet Take 1 tablet (81 mg total) by mouth daily. 90 tablet 3  . atorvastatin (LIPITOR) 10 MG tablet Take 10 mg by mouth daily.     No current facility-administered medications for this visit.      ROS: See HPI for pertinent positives and negatives.   Physical Examination  Vitals:   11/20/18 1142  BP: (!) 144/80  Resp: 14  Temp: 97.9 F (36.6 C)  TempSrc: Temporal  SpO2: 97%  Weight: 170 lb (77.1 kg)  Height: 5\' 6"  (1.676 m)   Body mass index is 27.44 kg/m.   His heart rate is 140/minute  General: A&O x 3, WDWN, male in NAD. Gait: normal HENT: No gross abnormalities.  Eyes: PERRLA. Pulmonary: Respirations are non labored, CTAB, good air movement in all fields Cardiac: Tachycardic at 140/minute, no detected murmur.   Old CABG scar noted.       Carotid Bruits Right Left   Negative Negative   Radial pulses are 2+ palpable bilaterally   Adominal aortic pulse is not palpable                         VASCULAR EXAM: Extremities with ischemic changes: left toes are dusky, without Gangrene; without open wounds.                                                                                                          LE Pulses Right Left       FEMORAL  3+ palpable  not palpable        POPLITEAL  not palpable   not palpable       POSTERIOR TIBIAL  not palpable   not palpable        DORSALIS PEDIS      ANTERIOR TIBIAL not palpable  not palpable    Abdomen: soft, NT, no palpable masses.  Skin: no rashes, no cellulitis, no ulcers noted. Musculoskeletal: no muscle wasting or atrophy.  Neurologic: A&O X 3; appropriate affect, Sensation is normal; MOTOR FUNCTION:  moving all extremities equally, motor strength 5/5 throughout. Speech is fluent/normal. CN 2-12 intact except is hard of hearing. Psychiatric: Thought content is normal, mood appropriate for clinical situation.     ASSESSMENT: Frank Arias is a 78 y.o. male who presents with: dusky left toes, left calf cramping worse for a week, right calf cramps with walking also.  ABI's today show no blood flow to his left ankle, left femoral pulse is not palpable.  Right ABI  He is tachycardic at 140/minute,  denies chest pain or dyspnea, denies  feeling light headed.    Normal serum creatinine of 0.99 on 01-01-17 (most recent).  8 am was last time he ate today.   Dr. Oneida Alar spoke with and examined pt. Will admit to VVS at [facility], [place] will write orders for CTA abd/pelvis with bilateral LE run off.  Need cardiology consult.   DATA  ABI (Date: 11/20/2018): ABI Findings: +---------+------------------+-----+-------------------+--------+ Right    Rt Pressure (mmHg)IndexWaveform           Comment  +---------+------------------+-----+-------------------+--------+ Brachial 135                                                +---------+------------------+-----+-------------------+--------+ PTA      91                0.67 dampened monophasic         +---------+------------------+-----+-------------------+--------+ DP       94                0.70 dampened monophasic         +---------+------------------+-----+-------------------+--------+ Great Toe59                0.44 Abnormal                    +---------+------------------+-----+-------------------+--------+  +---------+------------------+-----+--------+------------+ Left     Lt Pressure (mmHg)IndexWaveformComment      +---------+------------------+-----+--------+------------+ Brachial 135                                         +---------+------------------+-----+--------+------------+ PTA                             absent  undetectable +---------+------------------+-----+--------+------------+ DP                              absent  undetectable +---------+------------------+-----+--------+------------+ Great Toe                       absent  undetectable +---------+------------------+-----+--------+------------+  +-------+------------+------------+------------+------------+ ABI/TBIToday's ABI Today's TBI Previous ABIPrevious  TBI +-------+------------+------------+------------+------------+ Right  0.70        0.44        0.56        0.37         +-------+------------+------------+------------+------------+ Left   undetectableundetectable1.03        0.70         +-------+------------+------------+------------+------------+  Right ABIs and TBIs appear increased compared to prior study on 08/08/2017. Left ABIs and TBIs appear decreased compared to prior study on 08/08/2017.   Summary: Right: Resting right ankle-brachial index indicates moderate right lower extremity arterial disease. The right toe-brachial index is abnormal. RT great toe pressure = 59 mmHg. PPG tracings appear dampened.  Left: Signals were undetectable in the left ankle.    PLAN:  Dr. Oneida Alar spoke with and examined pt. Will admit to VVS at [facility], [place] will write orders for CTA abd/pelvis with bilateral LE run off.  Need cardiology consult.    Clemon Chambers, RN, MSN, FNP-C [facility] [employer] Phone: [phone]  Clinic MD: Laqueta Due  11/20/18 11:56 AM

## 2018-11-20 NOTE — Consult Note (Signed)
Cardiology Consultation:   Patient ID: Frank Arias MRN: [id number]; DOB: 1940-09-04  Admit date: 11/20/2018 Date of Consult: 11/20/2018  Primary Care Provider: Shirline Frees, MD Primary Cardiologist: Fransico Him, MD  Primary Electrophysiologist:  None    Patient Profile:   Frank Arias is a 78 y.o. male with a hx of CAD s/p CABG 2001 (LIMA to the LAD; SVG to RI; SVG to left Cx and SVG to PDA), Dizziness, Hyperlipidemia, Hypertension, ? Possible PFO diagnosed in 2018 (negative bubble study 10/2018), PVD s/p right CFA endarterectomy and profundoplasty by Dr. Oneida Alar in 2018,  who is being seen today for the evaluation of Tachycardia at the request of Dr. Carlis Abbott.  History of Present Illness:   [name] with remote history of AAA repair with left lower extremity femoral bypass in 2001 by Dr. Kellie Simmering. While being repaired he reported had a MI and pulmonary edema. He underwent left brachial catheter by Dr. Ilda Foil at that time that showed left main and three-vessel disease with mildly decreased LVEF. He then was set up for emergent CABG with Dr. Roxy Manns . CABG -LIMA to the LAD; SVG to RI; SVG to left Cx and SVG to PDA. Patient quit smoking. Patient initially did not follow-up. Patient was seen by cardiology in 2012 for pre-operative clearance. Stress test was ordered showing moderate inferior wall infarct from apex to base with no ischemia. EF 34%. Patient was cleared for surgery but recommended for close follow-up. He also reports having a right lower extremity arthrectomy several years ago in [place]. In 2018 he was seen by Dr. Einar Gip in the hospital for right leg pain. Lower extremety doppler showed occluded right SFA with mildly decreased perfusion on the left lower extremity. Patient was started on a statin. He had a right common femoral enterectomy with profundaplasty with resolution of pain. Patient was supposed to have f/u 6 months but did not. Patient was seen at [facility] cardiovascular in  August 2018 for syncope and was diagnosed with PFO.   Patient was referred by PCP to Dr. Radford Pax for dizziness and seen in the office 06/23/18. In the office orthostatics were normal. B/P well controlled. Continued on Lipitor 40mg  or hyperlipidemia. 2D echo and bubble study was ordered which showed LVEF 45-50% LV moderately dilated, mild concentric with LVH, impaired relaxation, normal LV filling pressures, diffuse hypokinesis, left atrial size was mildly dilated, moderate MR. There was no evidence of PFO. 30 day event monitor showed sinus bradycardia, NSR, and sinus tachy, average HR 72bpm, range from 51-149 bpm; wide complex tachycardia up to 9 beats at 136bpm; occasional PVCs. It also appears a Lexiscan was recommended but patient's daughter elected to defer.  Patient was seen today by Vascular in the office. He reported left calf pain after crawling under a house to do some plumbing. Prior to this his left calf was cramping after exertion. Reports using Xanax for the soreness in his left calf. Last ABIs were 08/08/2017 with Dr. Oneida Alar were normal on the left and reasonable on the right. In the office ABIs were rechecked. Right ABI indicated moderate right lower extremity. Left ABI signals were undetectable. Patient was reported to be tachycardic with rate of 140, not indicated if regular or irregular. EKG was not available at that time. Patient was admitted. CTA abd/pelvis with bilateral LE was ordered. Upon arrival to [facility] hospital heart rate was in the 80s and EKG shows NSR with 1 PAC.    Heart Pathway Score:     Past Medical  History:  Diagnosis Date   Basal cell carcinoma    Benign localized prostatic hyperplasia with lower urinary tract symptoms (LUTS)    CAD (coronary artery disease)    S/P CABG in 2001 with LIMA to the LAD, SVG to RI, SVG to left circumflex and SVG to PDA.   Dizzy spells    Erectile dysfunction    Grief reaction    Hypercholesterolemia    Hypertension    Myocardial  infarction [facility])    Peripheral vascular disease (HCC)    S/P right CFA endarterectomy and profundoplasty by Dr. Oneida Alar in 2018 with a prior history of aortobifem with left fem-BK popliteal graft.   PFO (patent foramen ovale)    on 12/06/16 echo [facility])   Syncope and collapse     Past Surgical History:  Procedure Laterality Date   ABDOMINAL AORTIC ANEURYSM REPAIR     CORONARY ARTERY BYPASS GRAFT  2001    Median sternotomy for coronary artery bypass grafting x 4 (left internal mammary artery to distal left anterior descending coronary artery, saphenous vein graft to ramus intermediate branch, sequential saphenous vein graft to second circumflex marginal branch, saphenous vein graft to posterior descending coronary artery).   DUPUYTREN CONTRACTURE RELEASE Left 2012   ENDARTERECTOMY FEMORAL Right 12/31/2016   Procedure: RIGHT FEMORAL ENDARTERECTOMY;  Surgeon: Elam Dutch, MD;  Location: [place];  Service: Vascular;  Laterality: Right;   KNEE ARTHROSCOPY Right 2001   LOWER EXTREMITY ANGIOGRAPHY N/A 12/07/2016   Procedure: Lower Extremity Angiography;  Surgeon: Adrian Prows, MD;  Location: [place] CV LAB;  Service: Cardiovascular;  Laterality: N/A;   MULTIPLE TOOTH EXTRACTIONS     NASAL SEPTUM SURGERY     PATCH ANGIOPLASTY Right 12/31/2016   Procedure: PATCH PROFUNDOPLASTY, Right Femoral Profunda using Hemashield vascular patch.;  Surgeon: Elam Dutch, MD;  Location: [facility];  Service: Vascular;  Laterality: Right;   THROMBECTOMY     TONSILLECTOMY       Home Medications:  Prior to Admission medications   Medication Sig Start Date End Date Taking? Authorizing Provider  aspirin EC 81 MG tablet Take 1 tablet (81 mg total) by mouth daily. 06/23/18   Sueanne Margarita, MD  atorvastatin (LIPITOR) 10 MG tablet Take 10 mg by mouth daily.    [provider]    Inpatient Medications: Scheduled Meds:  Continuous Infusions:  PRN Meds:   Allergies:     Allergies  Allergen Reactions   Morphine And Related Other (See Comments)    CONFUSED    Social History:   Social History   Socioeconomic History   Marital status: Widowed    Spouse name: Not on file   Number of children: Not on file   Years of education: Not on file   Highest education level: Not on file  Occupational History   Not on file  Social Needs   Financial resource strain: Not on file   Food insecurity    Worry: Not on file    Inability: Not on file   Transportation needs    Medical: Not on file    Non-medical: Not on file  Tobacco Use   Smoking status: Former Smoker    Types: Cigarettes    Quit date: 04/16/1998    Years since quitting: 20.6   Smokeless tobacco: Never Used  Substance and Sexual Activity   Alcohol use: Yes    Comment: occasional   Drug use: No   Sexual activity: Not on file  Lifestyle  Physical activity    Days per week: Not on file    Minutes per session: Not on file   Stress: Not on file  Relationships   Social connections    Talks on phone: Not on file    Gets together: Not on file    Attends religious service: Not on file    Active member of club or organization: Not on file    Attends meetings of clubs or organizations: Not on file    Relationship status: Not on file   Intimate partner violence    Fear of current or ex partner: Not on file    Emotionally abused: Not on file    Physically abused: Not on file    Forced sexual activity: Not on file  Other Topics Concern   Not on file  Social History Narrative   Not on file    Family History:    Family History  Problem Relation Age of Onset   Coronary artery disease Other    Lung cancer Other      ROS:  Please see the history of present illness.  All other ROS reviewed and negative.     Physical Exam/Data:  There were no vitals filed for this visit. No intake or output data in the 24 hours ending 11/20/18 1516 Last 3 Weights 11/20/2018 06/23/2018  08/08/2017  Weight (lbs) 170 lb 169 lb 9.6 oz 164 lb  Weight (kg) 77.111 kg 76.93 kg 74.39 kg     There is no height or weight on file to calculate BMI.  General:  Well nourished, well developed, in no acute distress HEENT: normal Lymph: no adenopathy Neck: no JVD Endocrine:  No thryomegaly Vascular: No carotid bruits; difficult to find pedal pulses bilaterally  Cardiac:  normal S1, S2; RRR; no murmur  Lungs:  Diminished breath sounds lower lung fields, no wheezing, rhonchi or rales  Abd: soft, nontender, no hepatomegaly  Ext: no edema Musculoskeletal:  No deformities, BUE and BLE strength normal and equal Skin: warm and dry  Neuro:  CNs 2-12 intact, no focal abnormalities noted Psych:  Normal affect   EKG:  The EKG was personally reviewed and demonstrates:  NSR, 86 bpm, TWI III and aVF, 1 PAC with ? abberancy Telemetry:  Telemetry was personally reviewed and demonstrates:  NSR, rates 70s with occasional PVCs  Relevant CV Studies:  Echo with Bubble study Nov 07, 2018  1. The left ventricle has mildly reduced systolic function, with an ejection fraction of 45-50%. The cavity size was moderately dilated. There is mild concentric left ventricular hypertrophy. Left ventricular diastolic Doppler parameters are consistent  with impaired relaxation. Left ventricular diffuse hypokinesis.  2. The right ventricle has normal systolic function. The cavity was normal. There is no increase in right ventricular wall thickness.  3. Left atrial size was mildly dilated.  4. Mitral valve regurgitation is moderate by color flow Doppler.  30 day event Monitor 09/19/18   Sinus bradycardia, normal sinus rhythm and sinus tachycardia. The average heart rate was 72bpm. The heart rate ranged from 51 to 149bpm.  Wide complex tachycardia up to 9 beats at 136bpm.  Occasional PVCs, bigeminal PVCs  Laboratory Data:  Labs Pending  Radiology/Studies:  Vas [place] Abi With/wo Tbi  Result Date: 11/20/2018 LOWER  EXTREMITY DOPPLER STUDY Indications: Peripheral artery disease. High Risk Factors: Hypertension, hyperlipidemia, past history of smoking, prior                    MI, coronary  artery disease.  Vascular Interventions: History of left femoral to below knee popliteal artery                         BPG; Right common femoral endarterectomy with                         profundaplasty 12/31/16. Performing Technologist: Burley Saver RVT  Examination Guidelines: A complete evaluation includes at minimum, Doppler waveform signals and systolic blood pressure reading at the level of bilateral brachial, anterior tibial, and posterior tibial arteries, when vessel segments are accessible. Bilateral testing is considered an integral part of a complete examination. Photoelectric Plethysmograph (PPG) waveforms and toe systolic pressure readings are included as required and additional duplex testing as needed. Limited examinations for reoccurring indications may be performed as noted.  ABI Findings: +---------+------------------+-----+-------------------+--------+  Right     Rt Pressure (mmHg) Index Waveform            Comment   +---------+------------------+-----+-------------------+--------+  Brachial  135                                                    +---------+------------------+-----+-------------------+--------+  PTA       91                 0.67  dampened monophasic           +---------+------------------+-----+-------------------+--------+  DP        94                 0.70  dampened monophasic           +---------+------------------+-----+-------------------+--------+  Great Toe 59                 0.44  Abnormal                      +---------+------------------+-----+-------------------+--------+ +---------+------------------+-----+--------+------------+  Left      Lt Pressure (mmHg) Index Waveform Comment       +---------+------------------+-----+--------+------------+  Brachial  135                                              +---------+------------------+-----+--------+------------+  PTA                                absent   undetectable  +---------+------------------+-----+--------+------------+  DP                                 absent   undetectable  +---------+------------------+-----+--------+------------+  Great Toe                          absent   undetectable  +---------+------------------+-----+--------+------------+ +-------+------------+------------+------------+------------+  ABI/TBI Today's ABI  Today's TBI  Previous ABI Previous TBI  +-------+------------+------------+------------+------------+  Right   0.70         0.44         0.56         0.37          +-------+------------+------------+------------+------------+  Left    undetectable undetectable 1.03         0.70          +-------+------------+------------+------------+------------+ Right ABIs and TBIs appear increased compared to prior study on 08/08/2017. Left ABIs and TBIs appear decreased compared to prior study on 08/08/2017.  Summary: Right: Resting right ankle-brachial index indicates moderate right lower extremity arterial disease. The right toe-brachial index is abnormal. RT great toe pressure = 59 mmHg. PPG tracings appear dampened. Left: Signals were undetectable in the left ankle.  *See table(s) above for measurements and observations.     Preliminary     Assessment and Plan:   Tachycardia/Cardiac Clearance Patient was in Vascular office visit this afternoon. ABI on the left side was found to have no pulse. Reported claudication/pain for the last week. Patient was reported to be tachycardic with rates in the 140s but no EKG available from that time. Patient was admitted. Vascular plan for surgery later this week.  - On admission STAT EKG showed NSR, 86 bpm with one PAC with ?aberrancy and no acute changes - Patient denies chest pain, sob, dizziness, or palpitations - H/o of PVD right leg CFA endarterectomy and profundoplasty on ASA -  Check TSH, BMP, CBC - Monitor on Tele. Currently showing NSR rates in the 70s. However, with reported HR in the 140s at Vascular office, would be concerned that he is perhaps experiencing AF RVR (no clear evidence yet though) - Plan for Limb ischemia per Vascular surgery - On IV heparin - Prior to left leg pain patient was very active. METS calculated 7.34  - Revised Cardiac Risk Index for Pre-op Risk calculated at 6% for 30-day risk of death, MI, or cardiac arrest. - Will order Echo  PVD/Ischemic limb left side - PVD s/p right CFA endarterectomy and profundoplasty by Dr. Oneida Alar in 2018 - Taking ASA - ABIs in Vascular office today with no pulse on the left side. Patient was admitted for further work-up and cardiac clearance - On IV heparin  - Plan per Vascular surgery  Hyperlipidemia - LDL goal <70. LDL 61 and ALT normal 06/18/2017 (per Dr. Radford Pax office visit 06/23/18, but cannot find in records) - Continue Lipitor 10mg   HTN - BP well controlled and has not required antihypertensives  CAD s/p CABG 2001 - No recent chest pain - ASA 81mg   For questions or updates, please contact [place] Please consult [email or web address] for contact info under    Signed, Lyncoln Ledgerwood Ninfa Meeker, PA-C  11/20/2018 3:16 PM

## 2018-11-20 NOTE — Progress Notes (Signed)
[facility] [place]   CC: Follow up peripheral artery occlusive disease  History of Present Illness Frank Arias is a 78 y.o. male who is s/p right CFA endarterectomy with profundaplasty by Dr. Oneida Alar 12/31/2016 who is here for routine follow-up.   He has a history of aortobifem and left fem-BK popliteal graft.  He was last evaluated by Dr. Oneida Alar on 08-08-17. At that time  ABIs were normal on the left and a reasonable ABI on the right side that was asymptomatic.  He was to follow-up in 1 year with ABIs and see our nurse practitioner.  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 Prior to this his left calf would cramp after he walked too far. Xanax is helping the soreness in his left calf. His right calf also bothers him since he crawled under the house a week ago.   He has a remote hx of a CABG, and states he has not seen a cardiologist in over 20 years.   He denies chest pain or dyspnea.  He denies feeling anxious.     Diabetic: No Tobacco use: former smoker, quit in 2000  Pt meds include: Statin :Yes Betablocker: No ASA: Yes Other anticoagulants/antiplatelets: no   Past Medical History:  Diagnosis Date  . Basal cell carcinoma   . Benign localized prostatic hyperplasia with lower urinary tract symptoms (LUTS)   . CAD (coronary artery disease)    S/P CABG in 2001 with LIMA to the LAD, SVG to RI, SVG to left circumflex and SVG to PDA.  Marland Kitchen Dizzy spells   . Erectile dysfunction   . Grief reaction   . Hypercholesterolemia   . Hypertension   . Myocardial infarction ([place])   . Peripheral vascular disease (HCC)    S/P right CFA endarterectomy and profundoplasty by Dr. Oneida Alar in 2018 with a prior history of aortobifem with left fem-BK popliteal graft.  Marland Kitchen PFO (patent foramen ovale)    on 12/06/16 echo [facility])  . Syncope and collapse     Social History Social History   Tobacco Use  . Smoking status: Former  Smoker    Types: Cigarettes    Quit date: 04/16/1998    Years since quitting: 20.6  . Smokeless tobacco: Never Used  Substance Use Topics  . Alcohol use: Yes    Comment: occasional  . Drug use: No    Family History Family History  Problem Relation Age of Onset  . Coronary artery disease Other   . Lung cancer Other     Past Surgical History:  Procedure Laterality Date  . ABDOMINAL AORTIC ANEURYSM REPAIR    . CORONARY ARTERY BYPASS GRAFT  2001    Median sternotomy for coronary artery bypass grafting x 4 (left internal mammary artery to distal left anterior descending coronary artery, saphenous vein graft to ramus intermediate branch, sequential saphenous vein graft to second circumflex marginal branch, saphenous vein graft to posterior descending coronary artery).  . DUPUYTREN CONTRACTURE RELEASE Left 2012  . ENDARTERECTOMY FEMORAL Right 12/31/2016   Procedure: RIGHT FEMORAL ENDARTERECTOMY;  Surgeon: Elam Dutch, MD;  Location: [place];  Service: Vascular;  Laterality: Right;  . KNEE ARTHROSCOPY Right 2001  . LOWER EXTREMITY ANGIOGRAPHY N/A 12/07/2016   Procedure: Lower Extremity Angiography;  Surgeon: Adrian Prows, MD;  Location: [place] CV LAB;  Service: Cardiovascular;  Laterality: N/A;  . MULTIPLE TOOTH EXTRACTIONS    . NASAL SEPTUM SURGERY    .  PATCH ANGIOPLASTY Right 12/31/2016   Procedure: PATCH PROFUNDOPLASTY, Right Femoral Profunda using Hemashield vascular patch.;  Surgeon: Elam Dutch, MD;  Location: [facility] OR;  Service: Vascular;  Laterality: Right;  . THROMBECTOMY    . TONSILLECTOMY      Allergies  Allergen Reactions  . Morphine And Related Other (See Comments)    CONFUSED    Current Outpatient Medications  Medication Sig Dispense Refill  . aspirin EC 81 MG tablet Take 1 tablet (81 mg total) by mouth daily. 90 tablet 3  . atorvastatin (LIPITOR) 10 MG tablet Take 10 mg by mouth daily.     No current facility-administered medications for this visit.      ROS: See HPI for pertinent positives and negatives.   Physical Examination  Vitals:   11/20/18 1142  BP: (!) 144/80  Resp: 14  Temp: 97.9 F (36.6 C)  TempSrc: Temporal  SpO2: 97%  Weight: 170 lb (77.1 kg)  Height: 5\' 6"  (1.676 m)   Body mass index is 27.44 kg/m.   His heart rate is 140/minute  General: A&O x 3, WDWN, male in NAD. Gait: normal HENT: No gross abnormalities.  Eyes: PERRLA. Pulmonary: Respirations are non labored, CTAB, good air movement in all fields Cardiac: Tachycardic at 140/minute, no detected murmur.   Old CABG scar noted.       Carotid Bruits Right Left   Negative Negative   Radial pulses are 2+ palpable bilaterally   Adominal aortic pulse is not palpable                         VASCULAR EXAM: Extremities with ischemic changes: left toes are dusky, without Gangrene; without open wounds.                                                                                                          LE Pulses Right Left       FEMORAL  3+ palpable  not palpable        POPLITEAL  not palpable   not palpable       POSTERIOR TIBIAL  not palpable   not palpable        DORSALIS PEDIS      ANTERIOR TIBIAL not palpable  not palpable    Abdomen: soft, NT, no palpable masses.  Skin: no rashes, no cellulitis, no ulcers noted. Musculoskeletal: no muscle wasting or atrophy.  Neurologic: A&O X 3; appropriate affect, Sensation is normal; MOTOR FUNCTION:  moving all extremities equally, motor strength 5/5 throughout. Speech is fluent/normal. CN 2-12 intact except is hard of hearing. Psychiatric: Thought content is normal, mood appropriate for clinical situation.     ASSESSMENT: Frank Arias is a 78 y.o. male who presents with: dusky left toes, left calf cramping worse for a week, right calf cramps with walking also.  ABI's today show no blood flow to his left ankle, left femoral pulse is not palpable.  Right ABI  He is tachycardic at 140/minute,  denies chest pain or dyspnea, denies  feeling light headed.    Normal serum creatinine of 0.99 on 01-01-17 (most recent).  8 am was last time he ate today.   Dr. Oneida Alar spoke with and examined pt. Will admit to VVS at [facility], [place] will write orders for CTA abd/pelvis with bilateral LE run off.  Need cardiology consult.   DATA  ABI (Date: 11/20/2018): ABI Findings: +---------+------------------+-----+-------------------+--------+ Right    Rt Pressure (mmHg)IndexWaveform           Comment  +---------+------------------+-----+-------------------+--------+ Brachial 135                                                +---------+------------------+-----+-------------------+--------+ PTA      91                0.67 dampened monophasic         +---------+------------------+-----+-------------------+--------+ DP       94                0.70 dampened monophasic         +---------+------------------+-----+-------------------+--------+ Great Toe59                0.44 Abnormal                    +---------+------------------+-----+-------------------+--------+  +---------+------------------+-----+--------+------------+ Left     Lt Pressure (mmHg)IndexWaveformComment      +---------+------------------+-----+--------+------------+ Brachial 135                                         +---------+------------------+-----+--------+------------+ PTA                             absent  undetectable +---------+------------------+-----+--------+------------+ DP                              absent  undetectable +---------+------------------+-----+--------+------------+ Great Toe                       absent  undetectable +---------+------------------+-----+--------+------------+  +-------+------------+------------+------------+------------+ ABI/TBIToday's ABI Today's TBI Previous ABIPrevious  TBI +-------+------------+------------+------------+------------+ Right  0.70        0.44        0.56        0.37         +-------+------------+------------+------------+------------+ Left   undetectableundetectable1.03        0.70         +-------+------------+------------+------------+------------+  Right ABIs and TBIs appear increased compared to prior study on 08/08/2017. Left ABIs and TBIs appear decreased compared to prior study on 08/08/2017.   Summary: Right: Resting right ankle-brachial index indicates moderate right lower extremity arterial disease. The right toe-brachial index is abnormal. RT great toe pressure = 59 mmHg. PPG tracings appear dampened.  Left: Signals were undetectable in the left ankle.    PLAN:  Dr. Oneida Alar spoke with and examined pt. Will admit to VVS at [facility], [place] will write orders for CTA abd/pelvis with bilateral LE run off.  Need cardiology consult.    Clemon Chambers, RN, MSN, FNP-C [facility] Phone: [phone]  Clinic MD: Laqueta Due  11/20/18 11:56 AM

## 2018-11-21 ENCOUNTER — Inpatient Hospital Stay (HOSPITAL_COMMUNITY): Payer: Medicare HMO

## 2018-11-21 DIAGNOSIS — Z01810 Encounter for preprocedural cardiovascular examination: Secondary | ICD-10-CM

## 2018-11-21 DIAGNOSIS — I361 Nonrheumatic tricuspid (valve) insufficiency: Secondary | ICD-10-CM

## 2018-11-21 DIAGNOSIS — I340 Nonrheumatic mitral (valve) insufficiency: Secondary | ICD-10-CM

## 2018-11-21 LAB — BASIC METABOLIC PANEL
Anion gap: 9 (ref 5–15)
BUN: 19 mg/dL (ref 8–23)
CO2: 23 mmol/L (ref 22–32)
Calcium: 8.7 mg/dL — ABNORMAL LOW (ref 8.9–10.3)
Chloride: 106 mmol/L (ref 98–111)
Creatinine, Ser: 1.01 mg/dL (ref 0.61–1.24)
GFR calc Af Amer: >60 mL/min (ref >60)
GFR calc non Af Amer: >60 mL/min (ref >60)
Glucose, Bld: 100 mg/dL — ABNORMAL HIGH (ref 70–99)
Potassium: 4.1 mmol/L (ref 3.5–5.1)
Sodium: 138 mmol/L (ref 135–145)

## 2018-11-21 LAB — CBC
HCT: 37.6 % — ABNORMAL LOW (ref 39.0–52.0)
Hemoglobin: 12.2 g/dL — ABNORMAL LOW (ref 13.0–17.0)
MCH: 29.1 pg (ref 26.0–34.0)
MCHC: 32.4 g/dL (ref 30.0–36.0)
MCV: 89.7 fL (ref 80.0–100.0)
Platelets: 144 10*3/uL — ABNORMAL LOW (ref 150–400)
RBC: 4.19 MIL/uL — ABNORMAL LOW (ref 4.22–5.81)
RDW: 13.5 % (ref 11.5–15.5)
WBC: 6.1 10*3/uL (ref 4.0–10.5)
nRBC: 0 % (ref 0.0–0.2)

## 2018-11-21 LAB — URINALYSIS, ROUTINE W REFLEX MICROSCOPIC
Bilirubin Urine: NEGATIVE
Glucose, UA: NEGATIVE mg/dL
Hgb urine dipstick: NEGATIVE
Ketones, ur: NEGATIVE mg/dL
Leukocytes,Ua: NEGATIVE
Nitrite: NEGATIVE
Protein, ur: NEGATIVE mg/dL
Specific Gravity, Urine: >1.046 — ABNORMAL HIGH (ref 1.005–1.030)
pH: 5 (ref 5.0–8.0)

## 2018-11-21 LAB — ECHOCARDIOGRAM LIMITED
Height: 66 in
Weight: 2719.98 oz

## 2018-11-21 LAB — NM MYOCAR MULTI W/SPECT W/WALL MOTION / EF
Estimated workload: 1 METS
Exercise duration (min): 5 min
Exercise duration (sec): 2 s
LV dias vol: 174 mL (ref 62–150)
LV sys vol: 113 mL
Peak HR: 92 {beats}/min
Rest HR: 73 {beats}/min
TID: 1.26

## 2018-11-21 LAB — HEPARIN LEVEL (UNFRACTIONATED): Heparin Unfractionated: 0.66 IU/mL (ref 0.30–0.70)

## 2018-11-21 MED ORDER — TECHNETIUM TC 99M TETROFOSMIN IV KIT
10.0000 | PACK | Freq: Once | INTRAVENOUS | Status: AC | PRN
  Administered 2018-11-21: 10 via INTRAVENOUS

## 2018-11-21 MED ORDER — REGADENOSON 0.4 MG/5ML IV SOLN
INTRAVENOUS | Status: AC
  Filled 2018-11-21: qty 5

## 2018-11-21 MED ORDER — PERFLUTREN LIPID MICROSPHERE
1.0000 mL | INTRAVENOUS | Status: AC | PRN
  Administered 2018-11-21: 4 mL via INTRAVENOUS
  Filled 2018-11-21: qty 10

## 2018-11-21 MED ORDER — REGADENOSON 0.4 MG/5ML IV SOLN
0.4000 mg | Freq: Once | INTRAVENOUS | Status: AC
  Administered 2018-11-21: 0.4 mg via INTRAVENOUS
  Filled 2018-11-21: qty 5

## 2018-11-21 MED ORDER — TECHNETIUM TC 99M TETROFOSMIN IV KIT
30.0000 | PACK | Freq: Once | INTRAVENOUS | Status: AC | PRN
  Administered 2018-11-21: 30 via INTRAVENOUS

## 2018-11-21 NOTE — Progress Notes (Signed)
Progress Note  Patient Name: DANNA CASELLA Date of Encounter: 11/21/2018  Primary Cardiologist: Fransico Him, MD   Subjective   Seen in nuc medicine for stress test. Laying comfortably. No chest pain or dyspnea.   Inpatient Medications    Scheduled Meds:  docusate sodium  100 mg Oral BID   pantoprazole  40 mg Oral Daily   regadenoson       Continuous Infusions:  sodium chloride 50 mL/hr at 11/20/18 2020   heparin 1,250 Units/hr (11/20/18 2020)   PRN Meds: acetaminophen **OR** acetaminophen, alum & mag hydroxide-simeth, bisacodyl, diphenhydrAMINE, guaiFENesin-dextromethorphan, hydrALAZINE, HYDROmorphone (DILAUDID) injection, labetalol, metoprolol tartrate, ondansetron, oxyCODONE-acetaminophen, phenol, senna-docusate, technetium tetrofosmin   Vital Signs    Vitals:   11/21/18 1004 11/21/18 1005 11/21/18 1006 11/21/18 1008  BP:  (!) 142/82 (!) 151/79 (!) 143/76  Pulse: 85 91 85 83  Resp:      Temp:      TempSrc:      SpO2:      Weight:      Height:        Intake/Output Summary (Last 24 hours) at 11/21/2018 1019 Last data filed at 11/21/2018 0805 Gross per 24 hour  Intake 1650.31 ml  Output 100 ml  Net 1550.31 ml   Last 3 Weights 11/20/2018 11/20/2018 06/23/2018  Weight (lbs) 170 lb 170 lb 169 lb 9.6 oz  Weight (kg) 77.111 kg 77.111 kg 76.93 kg      Telemetry    Unable to review as patient seen in nuc med  ECG    N/A  Physical Exam   GEN: No acute distress.   Neck: No JVD Cardiac: RRR, no murmurs, rubs, or gallops.  Respiratory: Clear to auscultation bilaterally. GI: Soft, nontender, non-distended  MS: No edema; No deformity. Cool feet, unable to palpate Left DP pulse Neuro:  Nonfocal  Psych: Normal affect   Labs    Chemistry Recent Labs  Lab 11/20/18 1647 11/20/18 2243 11/21/18 0349  NA 139 139 138  K 4.0 4.0 4.1  CL 104 105 106  CO2 25 24 23   GLUCOSE 100* 94 100*  BUN 23 21 19   CREATININE 1.00 1.06 1.01  CALCIUM 9.3 9.0 8.7*  PROT  --   7.5  --   ALBUMIN  --  3.7  --   AST  --  31  --   ALT  --  20  --   ALKPHOS  --  110  --   BILITOT  --  0.3  --   GFRNONAA >60 >60 >60  GFRAA >60 >60 >60  ANIONGAP 10 10 9      Hematology Recent Labs  Lab 11/20/18 1647 11/20/18 2243 11/21/18 0349  WBC 6.3 6.9 6.1  RBC 4.54 4.43 4.19*  HGB 13.4 12.8* 12.2*  HCT 40.7 39.7 37.6*  MCV 89.6 89.6 89.7  MCH 29.5 28.9 29.1  MCHC 32.9 32.2 32.4  RDW 13.3 13.3 13.5  PLT 171 162 144*     Radiology    Ct Angio Ao+bifem W & Or Wo Contrast  Result Date: 11/20/2018 CLINICAL DATA:  Decreased pulses in the left leg, history of aortobifemoral bypass graft EXAM: CT ANGIOGRAPHY OF ABDOMINAL AORTA WITH ILIOFEMORAL RUNOFF TECHNIQUE: Multidetector CT imaging of the abdomen, pelvis and lower extremities was performed using the standard protocol during bolus administration of intravenous contrast. Multiplanar CT image reconstructions and MIPs were obtained to evaluate the vascular anatomy. CONTRAST:  143mL OMNIPAQUE IOHEXOL 350 MG/ML SOLN COMPARISON:  None. FINDINGS:  VASCULAR Aorta: The abdominal aorta demonstrates diffuse atherosclerotic calcification. Mild mural thrombus is noted within. No aneurysmal dilatation is seen. Changes of aortobifemoral bypass graft are noted. The flow in the left limb of the graft is decreased compared to the right. Flow is noted to the level of the distal anastomosis. There is some flow into the deep circumflex iliac artery on the left. The right limb is widely patent. The distal anastomosis is widely patent with runoff predominately via the profundus femoral artery on the right. Celiac: Mild atherosclerotic changes are noted. The origin is otherwise within normal limits. SMA: Atherosclerotic calcifications are seen with mild narrowing. Renals: Single renal arteries are identified bilaterally. Atherosclerotic calcifications are noted at the origin of the right renal artery although no focal conclusion is seen. IMA: Occluded.  Reconstitution of the branches via collaterals from the superior mesenteric artery are noted. RIGHT Lower Extremity Inflow: As described above the predominant in flow is via the right limb of the aortobifemoral bypass graft. The distal anastomosis is widely patent with runoff via the profundus femoral artery. The native superficial femoral artery is occluded at its origin. Retrograde flow in the native external iliac artery is noted with opacification of the right internal iliac artery. There is aneurysmal dilatation at the level of the iliac bifurcation on the right measuring 2.6 cm. This is predominately mural thrombus. Runoff: Heavy calcifications of the superficial femoral artery are noted. Reconstitution of the infrapopliteal vessels is noted. There are diminutive in size with predominant runoff via the peroneal artery. Collateral flow to the distal posterior tibial and dorsalis pedis arteries is seen. LEFT Lower Extremity Inflow: Inflow on the left is via the left limb of the aortobifemoral bypass graft. The graft size is diminutive with some mural thrombus identified. The distal anastomosis shows flow minimally into the distal aspect of the left external iliac artery although no collateral flow to the left internal iliac artery is noted. Additionally opacification of the deep circumflex iliac artery is noted on the left. The remainder of the distal anastomotic site is occluded. Runoff: Some collateral flow to the profundus femoral branches is noted. The superficial femoral artery and popliteal artery are occluded. Minimal opacification of the infrapopliteal vessels is seen. There are patent proximally with predominant runoff via the posterior tibial artery. Scattered atherosclerotic changes are noted. Segmental areas of reconstitution in the anterior tibial artery are seen. Veins: No specific venous abnormality is noted. Review of the MIP images confirms the above findings. NON-VASCULAR Lower chest: Mild  emphysematous changes are noted. Fibrotic changes are noted particularly in the left lower lobe. No sizable effusion is noted. Hepatobiliary: Liver is within normal limits. The gallbladder is decompressed. Pancreas: Unremarkable. No pancreatic ductal dilatation or surrounding inflammatory changes. Spleen: Normal in size without focal abnormality. Adrenals/Urinary Tract: Adrenal glands are within normal limits. Kidneys demonstrate a normal enhancement pattern bilaterally. No renal calculi or obstructive changes are seen. The bladder is well distended. Stomach/Bowel: Stomach is within normal limits. Appendix appears normal. No evidence of bowel wall thickening, distention, or inflammatory changes. Lymphatic: No sizable lymphadenopathy is noted. Reproductive: Prostate is unremarkable. Other: No abdominal wall hernia or abnormality. No abdominopelvic ascites. Musculoskeletal: Degenerative changes of the lumbar spine are seen. No acute bony abnormality is noted. IMPRESSION: VASCULAR Changes consistent with aortobifemoral bypass graft. The left leg is diminutive in size due to distal occlusion of the anastomotic site with only minimal flow into the distal aspect of the left external iliac artery as well as the  deep circumflex iliac artery. The right limb is widely patent as described above. Occlusion of the right superficial femoral artery and popliteal artery. There is minimal reconstitution of the infrapopliteal vessels with predominant flow distally via the peroneal artery. Occlusion of the superficial femoral artery on the left. Minimal reconstitution of the profundus femoral artery branches is seen. There is also distal reconstitution of the infrapopliteal arteries with predominant runoff via the posterior tibial artery. NON-VASCULAR Chronic changes as described above. Electronically Signed   By: Inez Catalina M.D.   On: 11/20/2018 22:01   Vas [place] Abi With/wo Tbi  Result Date: 11/20/2018 LOWER EXTREMITY DOPPLER  STUDY Indications: Peripheral artery disease. High Risk Factors: Hypertension, hyperlipidemia, past history of smoking, prior                    MI, coronary artery disease.  Vascular Interventions: History of left femoral to below knee popliteal artery                         BPG; Right common femoral endarterectomy with                         profundaplasty 12/31/16. Performing Technologist: Burley Saver RVT  Examination Guidelines: A complete evaluation includes at minimum, Doppler waveform signals and systolic blood pressure reading at the level of bilateral brachial, anterior tibial, and posterior tibial arteries, when vessel segments are accessible. Bilateral testing is considered an integral part of a complete examination. Photoelectric Plethysmograph (PPG) waveforms and toe systolic pressure readings are included as required and additional duplex testing as needed. Limited examinations for reoccurring indications may be performed as noted.  ABI Findings: +---------+------------------+-----+-------------------+--------+  Right     Rt Pressure (mmHg) Index Waveform            Comment   +---------+------------------+-----+-------------------+--------+  Brachial  135                                                    +---------+------------------+-----+-------------------+--------+  PTA       91                 0.67  dampened monophasic           +---------+------------------+-----+-------------------+--------+  DP        94                 0.70  dampened monophasic           +---------+------------------+-----+-------------------+--------+  Great Toe 59                 0.44  Abnormal                      +---------+------------------+-----+-------------------+--------+ +---------+------------------+-----+--------+------------+  Left      Lt Pressure (mmHg) Index Waveform Comment       +---------+------------------+-----+--------+------------+  Brachial  135                                              +---------+------------------+-----+--------+------------+  PTA  absent   undetectable  +---------+------------------+-----+--------+------------+  DP                                 absent   undetectable  +---------+------------------+-----+--------+------------+  Great Toe                          absent   undetectable  +---------+------------------+-----+--------+------------+ +-------+------------+------------+------------+------------+  ABI/TBI Today's ABI  Today's TBI  Previous ABI Previous TBI  +-------+------------+------------+------------+------------+  Right   0.70         0.44         0.56         0.37          +-------+------------+------------+------------+------------+  Left    undetectable undetectable 1.03         0.70          +-------+------------+------------+------------+------------+ Right ABIs and TBIs appear increased compared to prior study on 08/08/2017. Left ABIs and TBIs appear decreased compared to prior study on 08/08/2017.  Summary: Right: Resting right ankle-brachial index indicates moderate right lower extremity arterial disease. The right toe-brachial index is abnormal. RT great toe pressure = 59 mmHg. PPG tracings appear dampened. Left: Signals were undetectable in the left ankle.  *See table(s) above for measurements and observations.  Electronically signed by Ruta Hinds MD on 11/20/2018 at 4:14:27 PM.    Final     Cardiac Studies   Pending echo and stress test   Patient Profile     SUHAIL PELOQUIN is a 78 y.o. male with a hx of CAD s/p CABG 2001 (LIMA to the LAD; SVG to RI; SVG to left Cx and SVG to PDA), Dizziness, Hyperlipidemia, Hypertension, ? Possible PFO diagnosed in 2018 (negative bubble study 10/2018), PVD s/p right CFA endarterectomy and profundoplasty by Dr. Oneida Alar in 2018 admitted directly from vascular office due to abnormal ABIs and tachycardia.   Assessment & Plan    1. Surgical clearance - No chest pain  - getting around 4  mets of activity - Final recommendations pending stress test and echo  2. Tachycardia - Rate stable during stress test - Pending echo - not on any rate control agent    For questions or updates, please contact [place] Please consult [email or web address] for contact info under        SignedLeanor Kail, PA  11/21/2018, 10:19 AM

## 2018-11-21 NOTE — Progress Notes (Signed)
ANTICOAGULATION CONSULT NOTE - Initial Consult  Pharmacy Consult for Heparin Indication: VTE Treatment, Occluded Aorto L Fem-BK Pop Bypass with Claudication     Allergies  Allergen Reactions  . Morphine And Related Other (See Comments)    CONFUSED    Patient Measurements: Height: 5\' 6"  (167.6 cm) Weight: 170 lb (77.1 kg)(from MD office) IBW/kg (Calculated) : 63.8Wt: 77.1 kg (11/20/18) Ht: 66 inches (11/20/18) Heparin Dosing Weight: 77.1 kg  Vital Signs: Temp: 97.8 F (36.6 C) (08/07 0348) Temp Source: Oral (08/07 0348) BP: 132/79 (08/07 0348) Pulse Rate: 69 (08/07 0348)  Labs: Scr pending  Medical History: Past Medical History:  Diagnosis Date  . Basal cell carcinoma   . Benign localized prostatic hyperplasia with lower urinary tract symptoms (LUTS)   . CAD (coronary artery disease)    S/P CABG in 2001 with LIMA to the LAD, SVG to RI, SVG to left circumflex and SVG to PDA.  Marland Kitchen Dizzy spells   . Erectile dysfunction   . Grief reaction   . Hypercholesterolemia   . Hypertension   . Myocardial infarction ([place])   . Peripheral vascular disease ([facility])    S/P right CFA endarterectomy and profundoplasty by Dr. Oneida Alar in 2018 with a prior history of aortobifem with left fem-BK popliteal graft.  Marland Kitchen PFO (patent foramen ovale)    on 12/06/16 echo [facility])  . Syncope and collapse     Assessment: 78 yr old man with hx of CAD (S/P CABG 2001), dizziness, hyperlipidemia, hypertension, known PFO (dx 2018), PVD (S/P R CFA endarterectomy and profundoplasty in 2018), remote hx AAA repair, LLE femoral bypass in 2001. Pt [facility] complaint of L calf pain and CT showed occluded aorto left fem-BK pop bypass.  Pharmacy dosing heparin -heparin level at goal, Hg with slight down trend  Goal of Therapy:  Heparin level 0.3-0.7 units/ml Monitor platelets by anticoagulation protocol: Yes   Plan:  -No heparin changes -Daily heparin level and CBC  Hildred Laser, PharmD Clinical Pharmacist **Pharmacist phone directory can now be found on [email or web address] ([email or web address]).  Listed under [place].

## 2018-11-21 NOTE — Plan of Care (Signed)
  Problem: Pain Managment: Goal: General experience of comfort will improve Outcome: Progressing   

## 2018-11-21 NOTE — Progress Notes (Signed)
Frank Arias presented for a nuclear stress test today.  No immediate complications.  Stress imaging is pending at this time.   Preliminary EKG findings may be listed in the chart, but the stress test result will not be finalized until perfusion imaging is complete.   Leanor Kail, [place]

## 2018-11-21 NOTE — Progress Notes (Signed)
Pt had run of Afib with RVR @ 150's while ambulating to bathroom. Pt asymptomatic. Pt spontaneously converted back to NSR @ 100. Pt goes between Afib and NSR frequently. Cardiology and Vascular notified. EKG on chart from telemetry.   Jerald Kief, RN

## 2018-11-21 NOTE — Progress Notes (Signed)
  Echocardiogram 2D Echocardiogram has been performed.  Frank Arias 11/21/2018, 4:50 PM

## 2018-11-22 ENCOUNTER — Encounter (HOSPITAL_COMMUNITY): Payer: Self-pay

## 2018-11-22 DIAGNOSIS — I70212 Atherosclerosis of native arteries of extremities with intermittent claudication, left leg: Secondary | ICD-10-CM

## 2018-11-22 DIAGNOSIS — I480 Paroxysmal atrial fibrillation: Secondary | ICD-10-CM

## 2018-11-22 LAB — CBC
HCT: 38.5 % — ABNORMAL LOW (ref 39.0–52.0)
Hemoglobin: 12.5 g/dL — ABNORMAL LOW (ref 13.0–17.0)
MCH: 29.4 pg (ref 26.0–34.0)
MCHC: 32.5 g/dL (ref 30.0–36.0)
MCV: 90.6 fL (ref 80.0–100.0)
Platelets: 170 10*3/uL (ref 150–400)
RBC: 4.25 MIL/uL (ref 4.22–5.81)
RDW: 13.7 % (ref 11.5–15.5)
WBC: 6.9 10*3/uL (ref 4.0–10.5)
nRBC: 0 % (ref 0.0–0.2)

## 2018-11-22 LAB — HEPARIN LEVEL (UNFRACTIONATED)
Heparin Unfractionated: 1.48 IU/mL — ABNORMAL HIGH (ref 0.30–0.70)
Heparin Unfractionated: 0.92 IU/mL — ABNORMAL HIGH (ref 0.30–0.70)

## 2018-11-22 MED ORDER — ASPIRIN 81 MG PO CHEW
81.0000 mg | CHEWABLE_TABLET | Freq: Every day | ORAL | Status: DC
  Administered 2018-11-22 – 2018-11-26 (×4): 81 mg via ORAL
  Filled 2018-11-22 (×4): qty 1

## 2018-11-22 MED ORDER — ATORVASTATIN CALCIUM 10 MG PO TABS
10.0000 mg | ORAL_TABLET | Freq: Every day | ORAL | Status: DC
  Administered 2018-11-22 – 2018-11-26 (×5): 10 mg via ORAL
  Filled 2018-11-22 (×5): qty 1

## 2018-11-22 MED ORDER — HEPARIN (PORCINE) 25000 UT/250ML-% IV SOLN
800.0000 [IU]/h | INTRAVENOUS | Status: DC
  Administered 2018-11-22: 1000 [IU]/h via INTRAVENOUS
  Administered 2018-11-23: 800 [IU]/h via INTRAVENOUS
  Filled 2018-11-22: qty 250

## 2018-11-22 MED ORDER — AMIODARONE HCL 200 MG PO TABS
400.0000 mg | ORAL_TABLET | Freq: Two times a day (BID) | ORAL | Status: DC
  Administered 2018-11-22 – 2018-11-26 (×8): 400 mg via ORAL
  Filled 2018-11-22 (×8): qty 2

## 2018-11-22 MED ORDER — REGADENOSON 0.4 MG/5ML IV SOLN
INTRAVENOUS | Status: AC
  Filled 2018-11-22: qty 5

## 2018-11-22 NOTE — Progress Notes (Signed)
ANTICOAGULATION CONSULT NOTE - Follow Up Consult  Pharmacy Consult for heparin Indication: occluded aorto L fem-BK pop bypass with claudication  Labs: Recent Labs    11/20/18 1647 11/20/18 2243 11/21/18 0349 11/22/18 0220  HGB 13.4 12.8* 12.2* 12.5*  HCT 40.7 39.7 37.6* 38.5*  PLT 171 162 144* 170  LABPROT  --  15.7*  --   --   INR  --  1.3*  --   --   HEPARINUNFRC  --  0.49 0.66 1.48*  CREATININE 1.00 1.06 1.01  --     Assessment: 78yo male now supratherapeutic on heparin after two levels at goal though had been trending up; no gtt issues or signs of bleeding and lab was drawn from opposite arm per RN.  Goal of Therapy:  Heparin level 0.3-0.7 units/ml   Plan:  Will hold heparin gtt x19min then decrease heparin gtt by 3-4 units/kg/hr to 1000 units/hr and check level in 8 hours.    Wynona Neat, PharmD, BCPS  11/22/2018,5:52 AM

## 2018-11-22 NOTE — Plan of Care (Signed)
Poc progressing.  

## 2018-11-22 NOTE — Progress Notes (Signed)
Progress Note  Patient Name: Frank Arias Date of Encounter: 11/22/2018  Primary Cardiologist: Fransico Him, MD   Subjective   Denies any chest pain or pressure.  No SOB.  Noted on tele yesterday to have paroxysmal atrial fibrillation with RVR  Inpatient Medications    Scheduled Meds:  aspirin  81 mg Oral Daily   atorvastatin  10 mg Oral Daily   docusate sodium  100 mg Oral BID   pantoprazole  40 mg Oral Daily   Continuous Infusions:  sodium chloride 50 mL/hr at 11/20/18 2020   heparin 1,000 Units/hr (11/22/18 0724)   PRN Meds: acetaminophen **OR** acetaminophen, alum & mag hydroxide-simeth, bisacodyl, diphenhydrAMINE, guaiFENesin-dextromethorphan, hydrALAZINE, HYDROmorphone (DILAUDID) injection, labetalol, metoprolol tartrate, ondansetron, oxyCODONE-acetaminophen, phenol, senna-docusate   Vital Signs    Vitals:   11/21/18 1820 11/21/18 2000 11/22/18 0510 11/22/18 0800  BP:  108/64 106/67 108/74  Pulse:      Resp: 17 16 18 20   Temp:  98.2 F (36.8 C) 98.1 F (36.7 C) 98.3 F (36.8 C)  TempSrc:  Oral Oral Oral  SpO2:  100% 99% 94%  Weight:      Height:        Intake/Output Summary (Last 24 hours) at 11/22/2018 0918 Last data filed at 11/22/2018 0300 Gross per 24 hour  Intake 1552.5 ml  Output 650 ml  Net 902.5 ml   Filed Weights   11/20/18 2023  Weight: 77.1 kg    Telemetry    NSR - Personally Reviewed  ECG    No new EKG to review - Personally Reviewed  Physical Exam   GEN: No acute distress.   Neck: No JVD Cardiac: RRR, no murmurs, rubs, or gallops.  Respiratory: Clear to auscultation bilaterally. GI: Soft, nontender, non-distended  MS: No edema; No deformity. Neuro:  Nonfocal  Psych: Normal affect   Labs    Chemistry Recent Labs  Lab 11/20/18 1647 11/20/18 2243 11/21/18 0349  NA 139 139 138  K 4.0 4.0 4.1  CL 104 105 106  CO2 25 24 23   GLUCOSE 100* 94 100*  BUN 23 21 19   CREATININE 1.00 1.06 1.01  CALCIUM 9.3 9.0 8.7*    PROT  --  7.5  --   ALBUMIN  --  3.7  --   AST  --  31  --   ALT  --  20  --   ALKPHOS  --  110  --   BILITOT  --  0.3  --   GFRNONAA >60 >60 >60  GFRAA >60 >60 >60  ANIONGAP 10 10 9      Hematology Recent Labs  Lab 11/20/18 2243 11/21/18 0349 11/22/18 0220  WBC 6.9 6.1 6.9  RBC 4.43 4.19* 4.25  HGB 12.8* 12.2* 12.5*  HCT 39.7 37.6* 38.5*  MCV 89.6 89.7 90.6  MCH 28.9 29.1 29.4  MCHC 32.2 32.4 32.5  RDW 13.3 13.5 13.7  PLT 162 144* 170    Cardiac EnzymesNo results for input(s): TROPONINI in the last 168 hours. No results for input(s): TROPIPOC in the last 168 hours.   BNPNo results for input(s): BNP, PROBNP in the last 168 hours.   DDimer No results for input(s): DDIMER in the last 168 hours.   Radiology    Ct Angio Ao+bifem W & Or Wo Contrast  Result Date: 11/20/2018 CLINICAL DATA:  Decreased pulses in the left leg, history of aortobifemoral bypass graft EXAM: CT ANGIOGRAPHY OF ABDOMINAL AORTA WITH ILIOFEMORAL RUNOFF TECHNIQUE: Multidetector CT imaging  of the abdomen, pelvis and lower extremities was performed using the standard protocol during bolus administration of intravenous contrast. Multiplanar CT image reconstructions and MIPs were obtained to evaluate the vascular anatomy. CONTRAST:  159mL OMNIPAQUE IOHEXOL 350 MG/ML SOLN COMPARISON:  None. FINDINGS: VASCULAR Aorta: The abdominal aorta demonstrates diffuse atherosclerotic calcification. Mild mural thrombus is noted within. No aneurysmal dilatation is seen. Changes of aortobifemoral bypass graft are noted. The flow in the left limb of the graft is decreased compared to the right. Flow is noted to the level of the distal anastomosis. There is some flow into the deep circumflex iliac artery on the left. The right limb is widely patent. The distal anastomosis is widely patent with runoff predominately via the profundus femoral artery on the right. Celiac: Mild atherosclerotic changes are noted. The origin is otherwise  within normal limits. SMA: Atherosclerotic calcifications are seen with mild narrowing. Renals: Single renal arteries are identified bilaterally. Atherosclerotic calcifications are noted at the origin of the right renal artery although no focal conclusion is seen. IMA: Occluded. Reconstitution of the branches via collaterals from the superior mesenteric artery are noted. RIGHT Lower Extremity Inflow: As described above the predominant in flow is via the right limb of the aortobifemoral bypass graft. The distal anastomosis is widely patent with runoff via the profundus femoral artery. The native superficial femoral artery is occluded at its origin. Retrograde flow in the native external iliac artery is noted with opacification of the right internal iliac artery. There is aneurysmal dilatation at the level of the iliac bifurcation on the right measuring 2.6 cm. This is predominately mural thrombus. Runoff: Heavy calcifications of the superficial femoral artery are noted. Reconstitution of the infrapopliteal vessels is noted. There are diminutive in size with predominant runoff via the peroneal artery. Collateral flow to the distal posterior tibial and dorsalis pedis arteries is seen. LEFT Lower Extremity Inflow: Inflow on the left is via the left limb of the aortobifemoral bypass graft. The graft size is diminutive with some mural thrombus identified. The distal anastomosis shows flow minimally into the distal aspect of the left external iliac artery although no collateral flow to the left internal iliac artery is noted. Additionally opacification of the deep circumflex iliac artery is noted on the left. The remainder of the distal anastomotic site is occluded. Runoff: Some collateral flow to the profundus femoral branches is noted. The superficial femoral artery and popliteal artery are occluded. Minimal opacification of the infrapopliteal vessels is seen. There are patent proximally with predominant runoff via the  posterior tibial artery. Scattered atherosclerotic changes are noted. Segmental areas of reconstitution in the anterior tibial artery are seen. Veins: No specific venous abnormality is noted. Review of the MIP images confirms the above findings. NON-VASCULAR Lower chest: Mild emphysematous changes are noted. Fibrotic changes are noted particularly in the left lower lobe. No sizable effusion is noted. Hepatobiliary: Liver is within normal limits. The gallbladder is decompressed. Pancreas: Unremarkable. No pancreatic ductal dilatation or surrounding inflammatory changes. Spleen: Normal in size without focal abnormality. Adrenals/Urinary Tract: Adrenal glands are within normal limits. Kidneys demonstrate a normal enhancement pattern bilaterally. No renal calculi or obstructive changes are seen. The bladder is well distended. Stomach/Bowel: Stomach is within normal limits. Appendix appears normal. No evidence of bowel wall thickening, distention, or inflammatory changes. Lymphatic: No sizable lymphadenopathy is noted. Reproductive: Prostate is unremarkable. Other: No abdominal wall hernia or abnormality. No abdominopelvic ascites. Musculoskeletal: Degenerative changes of the lumbar spine are seen. No acute bony  abnormality is noted. IMPRESSION: VASCULAR Changes consistent with aortobifemoral bypass graft. The left leg is diminutive in size due to distal occlusion of the anastomotic site with only minimal flow into the distal aspect of the left external iliac artery as well as the deep circumflex iliac artery. The right limb is widely patent as described above. Occlusion of the right superficial femoral artery and popliteal artery. There is minimal reconstitution of the infrapopliteal vessels with predominant flow distally via the peroneal artery. Occlusion of the superficial femoral artery on the left. Minimal reconstitution of the profundus femoral artery branches is seen. There is also distal reconstitution of the  infrapopliteal arteries with predominant runoff via the posterior tibial artery. NON-VASCULAR Chronic changes as described above. Electronically Signed   By: Inez Catalina M.D.   On: 11/20/2018 22:01   Nm Myocar Multi W/spect W/wall Motion / Ef  Result Date: 11/21/2018  Defect 1: There is a large defect of severe severity present in the basal anteroseptal, basal inferoseptal, basal inferior, basal inferolateral, mid inferoseptal, mid inferior, mid inferolateral, apical inferior and apex location.  Findings consistent with prior myocardial infarction with peri-infarct ischemia.  The left ventricular ejection fraction is moderately decreased (30-44%).  Change compared to prior study.  There is a large, severe defect across the entire septum and inferior wall to apex. On stress imaging, there is a partially reversible section inferolaterally and towards the apex. Overall it is a small proportion of the entire defect that is reversible. This is consistent with prior infarct with mild peri-infarct ischemia.   Vas [place] Burnard Bunting With/wo Tbi  Result Date: 11/20/2018 LOWER EXTREMITY DOPPLER STUDY Indications: Peripheral artery disease. High Risk Factors: Hypertension, hyperlipidemia, past history of smoking, prior                    MI, coronary artery disease.  Vascular Interventions: History of left femoral to below knee popliteal artery                         BPG; Right common femoral endarterectomy with                         profundaplasty 12/31/16. Performing Technologist: Burley Saver RVT  Examination Guidelines: A complete evaluation includes at minimum, Doppler waveform signals and systolic blood pressure reading at the level of bilateral brachial, anterior tibial, and posterior tibial arteries, when vessel segments are accessible. Bilateral testing is considered an integral part of a complete examination. Photoelectric Plethysmograph (PPG) waveforms and toe systolic pressure readings are included as required and  additional duplex testing as needed. Limited examinations for reoccurring indications may be performed as noted.  ABI Findings: +---------+------------------+-----+-------------------+--------+  Right     Rt Pressure (mmHg) Index Waveform            Comment   +---------+------------------+-----+-------------------+--------+  Brachial  135                                                    +---------+------------------+-----+-------------------+--------+  PTA       91                 0.67  dampened monophasic           +---------+------------------+-----+-------------------+--------+  DP  94                 0.70  dampened monophasic           +---------+------------------+-----+-------------------+--------+  Great Toe 59                 0.44  Abnormal                      +---------+------------------+-----+-------------------+--------+ +---------+------------------+-----+--------+------------+  Left      Lt Pressure (mmHg) Index Waveform Comment       +---------+------------------+-----+--------+------------+  Brachial  135                                             +---------+------------------+-----+--------+------------+  PTA                                absent   undetectable  +---------+------------------+-----+--------+------------+  DP                                 absent   undetectable  +---------+------------------+-----+--------+------------+  Great Toe                          absent   undetectable  +---------+------------------+-----+--------+------------+ +-------+------------+------------+------------+------------+  ABI/TBI Today's ABI  Today's TBI  Previous ABI Previous TBI  +-------+------------+------------+------------+------------+  Right   0.70         0.44         0.56         0.37          +-------+------------+------------+------------+------------+  Left    undetectable undetectable 1.03         0.70          +-------+------------+------------+------------+------------+ Right ABIs  and TBIs appear increased compared to prior study on 08/08/2017. Left ABIs and TBIs appear decreased compared to prior study on 08/08/2017.  Summary: Right: Resting right ankle-brachial index indicates moderate right lower extremity arterial disease. The right toe-brachial index is abnormal. RT great toe pressure = 59 mmHg. PPG tracings appear dampened. Left: Signals were undetectable in the left ankle.  *See table(s) above for measurements and observations.  Electronically signed by Ruta Hinds MD on 11/20/2018 at 4:14:27 PM.    Final     Cardiac Studies   11/21/2018 Nuclear stress test Study Result    Defect 1: There is a large defect of severe severity present in the basal anteroseptal, basal inferoseptal, basal inferior, basal inferolateral, mid inferoseptal, mid inferior, mid inferolateral, apical inferior and apex location.  Findings consistent with prior myocardial infarction with peri-infarct ischemia.  The left ventricular ejection fraction is moderately decreased (30-44%).  Change compared to prior study.   There is a large, severe defect across the entire septum and inferior wall to apex. On stress imaging, there is a partially reversible section inferolaterally and towards the apex. Overall it is a small proportion of the entire defect that is reversible. This is consistent with prior infarct with mild peri-infarct ischemia.     Patient Profile     78 y.o. male with hx of CAD with CABG 2001 (LIMA to the LAD; SVG to RI; SVG to left Cx and SVG to PDA), Dizziness, Hyperlipidemia, Hypertension,? PossiblePFO  diagnosed in 2018(negative bubble study 10/2018), PVD  with AAA repair and LLE fem bypass in 2001 and then s/p right CFA endarterectomy and profundoplasty by Dr. Oneida Alar in 2018.  Admitted with claudication in his LLE and dusky toes.  Assessment & Plan    1.  PAF -noted to have a HR in the 140's in vascular office the day of admission but in NSR on arrival at [facility] -no hx of  arrhythmias in the past and no hx of palpitations -noted yesterday to have PAF on tele with RVR -review of tele today shows intermittent afib with RVR -LA mildly dilated on echo -now on IV Heparin gtt -will start Amio 400mg  BID load over the weekend to try to prevent any intraop or post op arrhythmias issues -check TSH.  ALT normal.  Will get PFTs with DLCO for baseline -recommend changing to [place] post op once ok with Vascular surgyer.  CHADS2VASC score is 5.  2.  Preoperative Cardiac Clearance -asked to give cardiac clearance for LE bypass surgery planned for Monday -Prior to left leg pain patient was very active. METS calculated 7.34  -2D echo showed EF 40-45% with mid inferior AK and basal inferoseptal HK -EF actually improved from echo in 2016 -he is completely asymptomatic from a cardiac standpoint with no anginal CP or SOB -nuclear stress test showed prior inferior and inferoseptal infarct with minimal mild peri infarct ischemia.   -Revised Cardiac Risk Index for Pre-op Risk calculated at 6% for 30-day risk of death, MI, or cardiac arrest.  3.  PVD/Ischemic limb left side - PVD s/p right CFA endarterectomy and profundoplasty by Dr. Oneida Alar in 2018 - Taking ASA - ABIs in Vascular office with no pulse on the left side. Patient was admitted for further work-up and cardiac clearance -CT of abdomen and pelvis showed distal occlusion of the anastomotic site of the aortobifem bypass with only minimal flow into the distal left external iliac and deep circumflex iliac artery.  Also showed occluded right SFA and popliteal artery and flow distal via peroneal artery, occluded left SFA - On IV heparin  - Plan per Vascular surgery for left leg thrombectomy with possible bypass on Monday -continue ASA and statin  4.  Hyperlipidemia - LDL goal <70.  -LDL 61 and ALT normal 06/18/2017 - Continue Lipitor 10mg  -check FLP in am  5.  HTN - BP well controlled and has not required  antihypertensives  6.  ASCAD -s/p remote CABG -nuclear stress test with inferior infarct and minimal peri-infarct ischemia -he has not had any angina sx so will treat medically -continue ASA, statin -no BB due to soft BP     For questions or updates, please contact [facility] Please consult [email or web address] for contact info under Cardiology/STEMI.      Signed, Fransico Him, MD  11/22/2018, 9:18 AM

## 2018-11-22 NOTE — Progress Notes (Signed)
Pt ambulated to BR. HR sustaining 115-150's. EKG obtained showed a.fib RVR. Pt BP 150/112 (121). Pt asymptomatic. 5mg  IV metoprolol given. HR returned to 80-90's and BP 122/83 (96).  Clyde Canterbury, RN

## 2018-11-22 NOTE — Progress Notes (Signed)
ANTICOAGULATION CONSULT NOTE - Follow Up Consult  Pharmacy Consult for Heparin Indication: atrial fibrillation and occluded aorto L fem-BK pop bypass with claudication  Allergies  Allergen Reactions  . Morphine And Related Other (See Comments)    CONFUSED    Patient Measurements: Height: 5\' 6"  (167.6 cm) Weight: 170 lb (77.1 kg)(from MD office) IBW/kg (Calculated) : 63.8 Heparin Dosing Weight: 77.1  Vital Signs: Temp: 97.6 F (36.4 C) (08/08 1207) Temp Source: Oral (08/08 1207) BP: 122/83 (08/08 1439)  Labs: Recent Labs    11/20/18 1647  11/20/18 2243 11/21/18 0349 11/22/18 0220 11/22/18 1422  HGB 13.4  --  12.8* 12.2* 12.5*  --   HCT 40.7  --  39.7 37.6* 38.5*  --   PLT 171  --  162 144* 170  --   LABPROT  --   --  15.7*  --   --   --   INR  --   --  1.3*  --   --   --   HEPARINUNFRC  --    < > 0.49 0.66 1.48* 0.92*  CREATININE 1.00  --  1.06 1.01  --   --    < > = values in this interval not displayed.    Estimated Creatinine Clearance: 58.9 mL/min (by C-G formula based on SCr of 1.01 mg/dL).   Medications:  Scheduled:  . amiodarone  400 mg Oral BID  . aspirin  81 mg Oral Daily  . atorvastatin  10 mg Oral Daily  . docusate sodium  100 mg Oral BID  . pantoprazole  40 mg Oral Daily   Infusions:  . sodium chloride 50 mL/hr at 11/22/18 1036  . heparin 1,000 Units/hr (11/22/18 0724)    Assessment: 78 yr old man with hx of CAD (S/P CABG 2001), dizziness, hyperlipidemia, hypertension, known PFO (dx 2018), PVD (S/P R CFA endarterectomy and profundoplasty in 2018), remote hx AAA repair, LLE femoral bypass in 2001. Pt [facility] complaint of L calf pain and CT showed occluded aorto left fem-BK pop bypass. Pt also noted to have PAF. Pharmacy dosing heparin  - Heparin level is supratherapeutic on heparin rate of 1,000 units/hr. Hg/Hct are stable and WNLs. No signs/symptoms of bleeding or issues with infusion per RN. Level was drawn appropriately  from opposite arm.  Goal of Therapy:  Heparin level 0.3-0.7 units/ml Monitor platelets by anticoagulation protocol: Yes   Plan:  Decrease IV heparin infusion rate to 850 units/hr Recheck heparin level in 8 hours Monitor daily heparin level and CBC Continue to monitor H&H and platelets F/U on VVS plans for possible bypass.  Sherren Kerns, PharmD PGY1 Acute Care Pharmacy Resident [phone] 11/22/2018,2:56 PM

## 2018-11-22 NOTE — Progress Notes (Signed)
    Subjective  -   No overnight issues, still with short distance left leg claudication   Physical Exam:  Feet warm and perfused abd soft Non-labored breathing   Stress echo:  There is a large, severe defect across the entire septum and inferior wall to apex. On stress imaging, there is a partially reversible section inferolaterally and towards the apex. Overall it is a small proportion of the entire defect that is reversible. This is consistent with prior infarct with mild peri-infarct ischemia.   Assessment/Plan:    Continue IV heparin.  Plan is for left leg thrombectomy, possible bypass  on Monday, pending cardiology clearance  Continue statin for hypercholesterolemia  ? AFIB yesterday  [place] Frank Arias 11/22/2018 7:07 AM --  Vitals:   11/21/18 2000 11/22/18 0510  BP: 108/64 106/67  Pulse:    Resp: 16 18  Temp: 98.2 F (36.8 C) 98.1 F (36.7 C)  SpO2: 100% 99%    Intake/Output Summary (Last 24 hours) at 11/22/2018 0707 Last data filed at 11/22/2018 0300 Gross per 24 hour  Intake 1552.5 ml  Output 650 ml  Net 902.5 ml     Laboratory CBC    Component Value Date/Time   WBC 6.9 11/22/2018 0220   HGB 12.5 (L) 11/22/2018 0220   HCT 38.5 (L) 11/22/2018 0220   PLT 170 11/22/2018 0220    BMET    Component Value Date/Time   NA 138 11/21/2018 0349   K 4.1 11/21/2018 0349   CL 106 11/21/2018 0349   CO2 23 11/21/2018 0349   GLUCOSE 100 (H) 11/21/2018 0349   BUN 19 11/21/2018 0349   CREATININE 1.01 11/21/2018 0349   CALCIUM 8.7 (L) 11/21/2018 0349   GFRNONAA >60 11/21/2018 0349   GFRAA >60 11/21/2018 0349    COAG Lab Results  Component Value Date   INR 1.3 (H) 11/20/2018   INR 1.06 12/26/2016   No results found for: PTT  Antibiotics Anti-infectives (From admission, onward)   None       Frank Arias, M.D., [facility] [place] Office: [phone] Pager:  [phone]

## 2018-11-23 LAB — LIPID PANEL
Cholesterol: 108 mg/dL (ref 0–200)
HDL: 45 mg/dL (ref >40)
LDL Cholesterol: 51 mg/dL (ref 0–99)
Total CHOL/HDL Ratio: 2.4 RATIO
Triglycerides: 59 mg/dL (ref <150)
VLDL: 12 mg/dL (ref 0–40)

## 2018-11-23 LAB — TSH: TSH: 5.101 u[IU]/mL — ABNORMAL HIGH (ref 0.350–4.500)

## 2018-11-23 LAB — CBC
HCT: 38 % — ABNORMAL LOW (ref 39.0–52.0)
Hemoglobin: 11.8 g/dL — ABNORMAL LOW (ref 13.0–17.0)
MCH: 28.5 pg (ref 26.0–34.0)
MCHC: 31.1 g/dL (ref 30.0–36.0)
MCV: 91.8 fL (ref 80.0–100.0)
Platelets: 170 10*3/uL (ref 150–400)
RBC: 4.14 MIL/uL — ABNORMAL LOW (ref 4.22–5.81)
RDW: 13.7 % (ref 11.5–15.5)
WBC: 6.1 10*3/uL (ref 4.0–10.5)
nRBC: 0 % (ref 0.0–0.2)

## 2018-11-23 LAB — HEPARIN LEVEL (UNFRACTIONATED)
Heparin Unfractionated: 0.76 IU/mL — ABNORMAL HIGH (ref 0.30–0.70)
Heparin Unfractionated: 0.34 IU/mL (ref 0.30–0.70)

## 2018-11-23 MED ORDER — SODIUM CHLORIDE 0.9 % IV SOLN
1.5000 g | INTRAVENOUS | Status: AC
  Administered 2018-11-24: 08:00:00 1.5 g via INTRAVENOUS
  Filled 2018-11-23: qty 1.5

## 2018-11-23 MED ORDER — METOPROLOL TARTRATE 12.5 MG HALF TABLET
12.5000 mg | ORAL_TABLET | Freq: Two times a day (BID) | ORAL | Status: DC
  Administered 2018-11-23 – 2018-11-26 (×5): 12.5 mg via ORAL
  Filled 2018-11-23 (×6): qty 1

## 2018-11-23 MED ORDER — MUPIROCIN 2 % EX OINT
1.0000 "application " | TOPICAL_OINTMENT | Freq: Two times a day (BID) | CUTANEOUS | Status: DC
  Administered 2018-11-24 – 2018-11-25 (×3): 1 via NASAL
  Filled 2018-11-23: qty 22

## 2018-11-23 NOTE — Progress Notes (Signed)
ANTICOAGULATION CONSULT NOTE - Follow Up Consult  Pharmacy Consult for Heparin Indication: atrial fibrillation and occluded aorto L fem-BK pop bypass with claudication  Allergies  Allergen Reactions  . Morphine And Related Other (See Comments)    CONFUSED    Patient Measurements: Height: 5\' 6"  (167.6 cm) Weight: 170 lb (77.1 kg)(from MD office) IBW/kg (Calculated) : 63.8 Heparin Dosing Weight: 77.1  Vital Signs: Temp: 97.9 F (36.6 C) (08/08 2059) Temp Source: Oral (08/08 2059) BP: 128/76 (08/08 2059)  Labs: Recent Labs    11/20/18 1647  11/20/18 2243 11/21/18 0349 11/22/18 0220 11/22/18 1422 11/22/18 2329  HGB 13.4  --  12.8* 12.2* 12.5*  --   --   HCT 40.7  --  39.7 37.6* 38.5*  --   --   PLT 171  --  162 144* 170  --   --   LABPROT  --   --  15.7*  --   --   --   --   INR  --   --  1.3*  --   --   --   --   HEPARINUNFRC  --    < > 0.49 0.66 1.48* 0.92* 0.76*  CREATININE 1.00  --  1.06 1.01  --   --   --    < > = values in this interval not displayed.    Estimated Creatinine Clearance: 58.9 mL/min (by C-G formula based on SCr of 1.01 mg/dL).   Medications:  Scheduled:  . amiodarone  400 mg Oral BID  . aspirin  81 mg Oral Daily  . atorvastatin  10 mg Oral Daily  . docusate sodium  100 mg Oral BID  . pantoprazole  40 mg Oral Daily   Infusions:  . sodium chloride 50 mL/hr at 11/22/18 1036  . heparin 850 Units/hr (11/22/18 1531)    Assessment: 78 yr old man with hx of CAD (S/P CABG 2001), dizziness, hyperlipidemia, hypertension, known PFO (dx 2018), PVD (S/P R CFA endarterectomy and profundoplasty in 2018), remote hx AAA repair, LLE femoral bypass in 2001. Pt [facility] complaint of L calf pain and CT showed occluded aorto left fem-BK pop bypass. Pt also noted to have PAF. Pharmacy dosing heparin  Heparin level remains slightly elevated at 0.76 units/ml  Goal of Therapy:  Heparin level 0.3-0.7 units/ml Monitor platelets by  anticoagulation protocol: Yes   Plan:  Decrease IV heparin infusion rate to 750 units/hr Monitor daily heparin level and CBC  Thanks for allowing pharmacy to be a part of this patient's care.  Excell Seltzer, PharmD Clinical Pharmacist 11/23/2018,12:14 AM

## 2018-11-23 NOTE — Progress Notes (Signed)
[facility] [place]  Subjective  - Comfortable and ready for surgery tomorrow.   Objective 123/62 69 98 F (36.7 C) (Oral) 18 94%  Intake/Output Summary (Last 24 hours) at 11/23/2018 0836 Last data filed at 11/23/2018 0540 Gross per 24 hour  Intake 1942.74 ml  Output 350 ml  Net 1592.74 ml   B LE warm with intact active range of motion. Lungs non labored breathing Gen NAD   Assessment/Planning: Left LE occlusion  IV heparin Pending surgery tomorrow with Dr. Oneida Alar for Plan is for left leg thrombectomy, possible bypass. Cardiology cleared patient for procedure. NPO past MN  Roxy Horseman 11/23/2018 8:36 AM --  Laboratory Lab Results: Recent Labs    11/22/18 0220 11/23/18 0720  WBC 6.9 6.1  HGB 12.5* 11.8*  HCT 38.5* 38.0*  PLT 170 170   BMET Recent Labs    11/20/18 2243 11/21/18 0349  NA 139 138  K 4.0 4.1  CL 105 106  CO2 24 23  GLUCOSE 94 100*  BUN 21 19  CREATININE 1.06 1.01  CALCIUM 9.0 8.7*    COAG Lab Results  Component Value Date   INR 1.3 (H) 11/20/2018   INR 1.06 12/26/2016   No results found for: PTT

## 2018-11-23 NOTE — Progress Notes (Signed)
  --  Subjective  -   Ready for surgery Ambulating in halls   Physical Exam:  abd soft Legs warm.  Can move toes Non-labored breathing       Assessment/Plan:    Cleared for surgery by cardiology Plan left leg revascularization tomorrow Continue heparin drip NPO after midnight   Vitals:   11/23/18 0818 11/23/18 1158  BP: 123/62 113/75  Pulse: 69 98  Resp: 18 16  Temp: 98 F (36.7 C) 97.8 F (36.6 C)  SpO2: 94% 90%    Intake/Output Summary (Last 24 hours) at 11/23/2018 1508 Last data filed at 11/23/2018 1300 Gross per 24 hour  Intake 1805 ml  Output -  Net 1805 ml     Laboratory CBC    Component Value Date/Time   WBC 6.1 11/23/2018 0720   HGB 11.8 (L) 11/23/2018 0720   HCT 38.0 (L) 11/23/2018 0720   PLT 170 11/23/2018 0720    BMET    Component Value Date/Time   NA 138 11/21/2018 0349   K 4.1 11/21/2018 0349   CL 106 11/21/2018 0349   CO2 23 11/21/2018 0349   GLUCOSE 100 (H) 11/21/2018 0349   BUN 19 11/21/2018 0349   CREATININE 1.01 11/21/2018 0349   CALCIUM 8.7 (L) 11/21/2018 0349   GFRNONAA >60 11/21/2018 0349   GFRAA >60 11/21/2018 0349    COAG Lab Results  Component Value Date   INR 1.3 (H) 11/20/2018   INR 1.06 12/26/2016   No results found for: PTT  Antibiotics Anti-infectives (From admission, onward)   Start     Dose/Rate Route Frequency Ordered Stop   11/24/18 0600  cefUROXime (ZINACEF) 1.5 g in sodium chloride 0.9 % 100 mL IVPB     1.5 g 200 mL/hr over 30 Minutes Intravenous On call to O.R. 11/23/18 4097 11/25/18 0559       V. Leia Alf, M.D., [facility] [place] Office: [phone] Pager:  [phone]

## 2018-11-23 NOTE — Progress Notes (Addendum)
Progress Note  Patient Name: Frank Arias Date of Encounter: 11/23/2018  Primary Cardiologist: Fransico Him, MD   Subjective   No CP or SOB.  Having PAF with RVR on tele and started on PO Amio load  Inpatient Medications    Scheduled Meds: . amiodarone  400 mg Oral BID  . aspirin  81 mg Oral Daily  . atorvastatin  10 mg Oral Daily  . docusate sodium  100 mg Oral BID  . pantoprazole  40 mg Oral Daily   Continuous Infusions: . sodium chloride 50 mL/hr at 11/22/18 1036  . heparin 750 Units/hr (11/23/18 0013)   PRN Meds: acetaminophen **OR** acetaminophen, alum & mag hydroxide-simeth, bisacodyl, diphenhydrAMINE, guaiFENesin-dextromethorphan, hydrALAZINE, HYDROmorphone (DILAUDID) injection, labetalol, metoprolol tartrate, ondansetron, oxyCODONE-acetaminophen, phenol, senna-docusate   Vital Signs    Vitals:   11/22/18 1439 11/22/18 1630 11/22/18 2059 11/23/18 0540  BP: 122/83 118/68 128/76 112/82  Pulse:    66  Resp: 20 17 17 20   Temp:  98.9 F (37.2 C) 97.9 F (36.6 C) 97.9 F (36.6 C)  TempSrc:  Oral Oral Oral  SpO2: 95%  98% 97%  Weight:      Height:        Intake/Output Summary (Last 24 hours) at 11/23/2018 0715 Last data filed at 11/23/2018 0540 Gross per 24 hour  Intake 2182.74 ml  Output 350 ml  Net 1832.74 ml   Filed Weights   11/20/18 2023  Weight: 77.1 kg    Telemetry    NSR with nonsustained PAF- Personally Reviewed  ECG    No new EKG to review- Personally Reviewed  Physical Exam   GEN: Well nourished, well developed in no acute distress HEENT: Normal NECK: No JVD; No carotid bruits LYMPHATICS: No lymphadenopathy CARDIAC:RRR, no murmurs, rubs, gallops RESPIRATORY:  Clear to auscultation without rales, wheezing or rhonchi  ABDOMEN: Soft, non-tender, non-distended MUSCULOSKELETAL:  No edema; No deformity. Cool feet.  No palpable pulse in left foot SKIN: cool feet NEUROLOGIC:  Alert and oriented x 3 PSYCHIATRIC:  Normal affect    Labs     Chemistry Recent Labs  Lab 11/20/18 1647 11/20/18 2243 11/21/18 0349  NA 139 139 138  K 4.0 4.0 4.1  CL 104 105 106  CO2 25 24 23   GLUCOSE 100* 94 100*  BUN 23 21 19   CREATININE 1.00 1.06 1.01  CALCIUM 9.3 9.0 8.7*  PROT  --  7.5  --   ALBUMIN  --  3.7  --   AST  --  31  --   ALT  --  20  --   ALKPHOS  --  110  --   BILITOT  --  0.3  --   GFRNONAA >60 >60 >60  GFRAA >60 >60 >60  ANIONGAP 10 10 9      Hematology Recent Labs  Lab 11/20/18 2243 11/21/18 0349 11/22/18 0220  WBC 6.9 6.1 6.9  RBC 4.43 4.19* 4.25  HGB 12.8* 12.2* 12.5*  HCT 39.7 37.6* 38.5*  MCV 89.6 89.7 90.6  MCH 28.9 29.1 29.4  MCHC 32.2 32.4 32.5  RDW 13.3 13.5 13.7  PLT 162 144* 170    Cardiac EnzymesNo results for input(s): TROPONINI in the last 168 hours. No results for input(s): TROPIPOC in the last 168 hours.   BNPNo results for input(s): BNP, PROBNP in the last 168 hours.   DDimer No results for input(s): DDIMER in the last 168 hours.   Radiology    Nm Myocar Multi W/spect  W/wall Motion / Ef  Result Date: 11/21/2018  Defect 1: There is a large defect of severe severity present in the basal anteroseptal, basal inferoseptal, basal inferior, basal inferolateral, mid inferoseptal, mid inferior, mid inferolateral, apical inferior and apex location.  Findings consistent with prior myocardial infarction with peri-infarct ischemia.  The left ventricular ejection fraction is moderately decreased (30-44%).  Change compared to prior study.  There is a large, severe defect across the entire septum and inferior wall to apex. On stress imaging, there is a partially reversible section inferolaterally and towards the apex. Overall it is a small proportion of the entire defect that is reversible. This is consistent with prior infarct with mild peri-infarct ischemia.    Cardiac Studies   11/21/2018 Nuclear stress test Study Result    Defect 1: There is a large defect of severe severity present in  the basal anteroseptal, basal inferoseptal, basal inferior, basal inferolateral, mid inferoseptal, mid inferior, mid inferolateral, apical inferior and apex location.  Findings consistent with prior myocardial infarction with peri-infarct ischemia.  The left ventricular ejection fraction is moderately decreased (30-44%).  Change compared to prior study.   There is a large, severe defect across the entire septum and inferior wall to apex. On stress imaging, there is a partially reversible section inferolaterally and towards the apex. Overall it is a small proportion of the entire defect that is reversible. This is consistent with prior infarct with mild peri-infarct ischemia.     Patient Profile     78 y.o. male with hx of CAD with CABG 2001 (LIMA to the LAD; SVG to RI; SVG to left Cx and SVG to PDA), Dizziness, Hyperlipidemia, Hypertension,? PossiblePFO diagnosed in 2018(negative bubble study 10/2018), PVD  with AAA repair and LLE fem bypass in 2001 and then s/p right CFA endarterectomy and profundoplasty by Dr. Oneida Alar in 2018.  Admitted with claudication in his LLE and dusky toes.  Assessment & Plan    1.  PAF -noted to have a HR in the 140's in vascular office the day of admission but in NSR on arrival at [facility] -no hx of arrhythmias in the past and no hx of palpitations -noted Friday to have PAF on tele with RVR -LA mildly dilated on echo -now on IV Heparin gtt -started yesterday on Amio 400mg  BID load over the weekend to try to prevent any intraop or post op arrhythmias issues -still having some short bursts of PAF so will add on low dose Lopressor 12.5mg  BID -check TSH.  ALT normal.  -PFTs with DLCO for baseline are pending -recommend changing to [place] post op once ok with Vascular surgery CHADS2VASC score is 5. -follow daily EKG to check QTc while loading Amio  2.  Preoperative Cardiac Clearance -asked to give cardiac clearance for LE bypass surgery planned for Monday -Prior to  left leg pain patient was very active. METS calculated 7.34  -2D echo showed EF 40-45% with mid inferior AK and basal inferoseptal HK -EF actually improved from echo in 2016 -he is completely asymptomatic from a cardiac standpoint with no anginal CP or SOB -nuclear stress test showed prior inferior and inferoseptal infarct with minimal mild peri infarct ischemia.   -Revised Cardiac Risk Index for Pre-op Risk calculated at 6% for 30-day risk of death, MI, or cardiac arrest.  3.  PVD/Ischemic limb left side - PVD s/p right CFA endarterectomy and profundoplasty by Dr. Oneida Alar in 2018 - Taking ASA - ABIs in Vascular office with no pulse on the  left side. Patient was admitted for further work-up and cardiac clearance -CT of abdomen and pelvis showed distal occlusion of the anastomotic site of the aortobifem bypass with only minimal flow into the distal left external iliac and deep circumflex iliac artery.  Also showed occluded right SFA and popliteal artery and flow distal via peroneal artery, occluded left SFA - On IV heparin  - Plan per Vascular surgery for left leg thrombectomy with possible bypass on Monday -continue ASA and statin  4.  Hyperlipidemia - LDL goal <70.  -LDL 61 and ALT normal 06/18/2017 - Continue Lipitor 10mg  -check FLP in am  5.  HTN - BP well controlled and has not required antihypertensives  6.  ASCAD -s/p remote CABG -nuclear stress test with inferior infarct and minimal peri-infarct ischemia -he has not had any angina sx so will treat medically -continue ASA, statin -no BB or long acting nitrates due to soft BP     For questions or updates, please contact [facility] Please consult [email or web address] for contact info under Cardiology/STEMI.      Signed, Fransico Him, MD  11/23/2018, 7:15 AM

## 2018-11-23 NOTE — Anesthesia Preprocedure Evaluation (Addendum)
Anesthesia Evaluation  Patient identified by MRN, date of birth, ID band Patient awake    Reviewed: Allergy & Precautions, NPO status , Patient's Chart, lab work & pertinent test results, reviewed documented beta blocker date and time   Airway Mallampati: II  TM Distance: >3 FB Neck ROM: Full    Dental  (+) Dental Advisory Given, Edentulous Lower, Edentulous Upper   Pulmonary former smoker,    Pulmonary exam normal breath sounds clear to auscultation       Cardiovascular hypertension, Pt. on home beta blockers and Pt. on medications + CAD, + Past MI, + CABG (CABG 2001 (LIMA to the LAD; SVG to RI; SVG to left Cx and SVG to PDA)) and + Peripheral Vascular Disease (AAA repair and LLE fem bypass in 2001 and then s/p right CFA endarterectomy and profundoplasty )  Normal cardiovascular exam+ dysrhythmias Atrial Fibrillation  Rhythm:Regular Rate:Normal  +PFO 2D echo showed EF 40-45% with mid inferior AK and basal inferoseptal HK   Neuro/Psych negative neurological ROS     GI/Hepatic negative GI ROS, Neg liver ROS,   Endo/Other  negative endocrine ROS  Renal/GU negative Renal ROS     Musculoskeletal negative musculoskeletal ROS (+)   Abdominal   Peds  Hematology  (+) Blood dyscrasia, anemia ,   Anesthesia Other Findings Day of surgery medications reviewed with the patient.  Reproductive/Obstetrics                           Anesthesia Physical Anesthesia Plan  ASA: IV  Anesthesia Plan: General   Post-op Pain Management:    Induction: Intravenous  PONV Risk Score and Plan: 3 and Dexamethasone, Ondansetron and Treatment may vary due to age or medical condition  Airway Management Planned: Oral ETT  Additional Equipment: Arterial line  Intra-op Plan:   Post-operative Plan: Extubation in OR  Informed Consent: I have reviewed the patients History and Physical, chart, labs and discussed the  procedure including the risks, benefits and alternatives for the proposed anesthesia with the patient or authorized representative who has indicated his/her understanding and acceptance.     Dental advisory given  Plan Discussed with: CRNA  Anesthesia Plan Comments: (2 large bore PIV)       Anesthesia Quick Evaluation

## 2018-11-23 NOTE — Progress Notes (Signed)
ANTICOAGULATION CONSULT NOTE - Follow Up Consult  Pharmacy Consult for Heparin Indication: atrial fibrillation and occluded aorto L fem-BK pop bypass with claudication  Allergies  Allergen Reactions  . Morphine And Related Other (See Comments)    CONFUSED    Patient Measurements: Height: 5\' 6"  (167.6 cm) Weight: 170 lb (77.1 kg)(from MD office) IBW/kg (Calculated) : 63.8 Heparin Dosing Weight: 77.1  Vital Signs: Temp: 98 F (36.7 C) (08/09 0818) Temp Source: Oral (08/09 0818) BP: 123/62 (08/09 0818) Pulse Rate: 69 (08/09 0818)  Labs: Recent Labs    11/20/18 1647  11/20/18 2243 11/21/18 0349 11/22/18 0220 11/22/18 1422 11/22/18 2329 11/23/18 0720  HGB 13.4  --  12.8* 12.2* 12.5*  --   --  11.8*  HCT 40.7  --  39.7 37.6* 38.5*  --   --  38.0*  PLT 171  --  162 144* 170  --   --  170  LABPROT  --   --  15.7*  --   --   --   --   --   INR  --   --  1.3*  --   --   --   --   --   HEPARINUNFRC  --    < > 0.49 0.66 1.48* 0.92* 0.76* 0.34  CREATININE 1.00  --  1.06 1.01  --   --   --   --    < > = values in this interval not displayed.    Estimated Creatinine Clearance: 58.9 mL/min (by C-G formula based on SCr of 1.01 mg/dL).   Medications:  Scheduled:  . amiodarone  400 mg Oral BID  . aspirin  81 mg Oral Daily  . atorvastatin  10 mg Oral Daily  . docusate sodium  100 mg Oral BID  . metoprolol tartrate  12.5 mg Oral BID  . pantoprazole  40 mg Oral Daily   Infusions:  . sodium chloride 50 mL/hr at 11/23/18 0913  . [START ON 11/24/2018] cefUROXime (ZINACEF)  IV    . heparin 750 Units/hr (11/23/18 0013)    Assessment: 78 yr old man with hx of CAD (S/P CABG 2001), dizziness, hyperlipidemia, hypertension, known PFO (dx 2018), PVD (S/P R CFA endarterectomy and profundoplasty in 2018), remote hx AAA repair, LLE femoral bypass in 2001. [facility] L calf pain and CT showed occluded aorto left fem-BK pop bypass. Pt also noted to have  PAF. Pharmacy dosing heparin  - Heparin level is therapeutic at 0.34 on heparin rate of 750 units/hr. Given patient's active clot along with having a-fib, would aim for a slightly higher heparin level. Hg/Hct & platelets are stable and WNLs. No signs/symptoms of bleeding or issues with infusion per RN.   Goal of Therapy:  Heparin level 0.3-0.7 units/ml Monitor platelets by anticoagulation protocol: Yes   Plan:  Increase IV heparin infusion rate to 800 units/hr to keep patient in therapeutic range Monitor daily heparin level and CBC Continue to monitor H&H and platelets VVS plannnig for left leg thrombectomy and possible bypass tomorrow.  Sherren Kerns, PharmD PGY1 Acute Care Pharmacy Resident [phone] 11/23/2018,9:58 AM

## 2018-11-24 ENCOUNTER — Inpatient Hospital Stay (HOSPITAL_COMMUNITY): Payer: Medicare HMO | Admitting: Certified Registered Nurse Anesthetist

## 2018-11-24 ENCOUNTER — Encounter (HOSPITAL_COMMUNITY)
Admission: AD | Disposition: A | Discharge status: Home / Self Care | Payer: Self-pay | Source: Ambulatory Visit | Attending: Vascular Surgery

## 2018-11-24 ENCOUNTER — Inpatient Hospital Stay (HOSPITAL_COMMUNITY): Payer: Medicare HMO

## 2018-11-24 DIAGNOSIS — I70222 Atherosclerosis of native arteries of extremities with rest pain, left leg: Secondary | ICD-10-CM

## 2018-11-24 DIAGNOSIS — I743 Embolism and thrombosis of arteries of the lower extremities: Secondary | ICD-10-CM

## 2018-11-24 DIAGNOSIS — T82868A Thrombosis of vascular prosthetic devices, implants and grafts, initial encounter: Secondary | ICD-10-CM

## 2018-11-24 DIAGNOSIS — I998 Other disorder of circulatory system: Secondary | ICD-10-CM

## 2018-11-24 HISTORY — PX: ENDARTERECTOMY FEMORAL: SHX5804

## 2018-11-24 HISTORY — PX: PATCH ANGIOPLASTY: SHX6230

## 2018-11-24 HISTORY — PX: THROMBECTOMY ILIAC ARTERY: SHX6405

## 2018-11-24 LAB — BLOOD GAS, ARTERIAL
Acid-base deficit: 6.1 mmol/L — ABNORMAL HIGH (ref 0.0–2.0)
Bicarbonate: 18 mmol/L — ABNORMAL LOW (ref 20.0–28.0)
FIO2: 21
O2 Saturation: 94.7 %
Patient temperature: 98.6
pCO2 arterial: 31.1 mmHg — ABNORMAL LOW (ref 32.0–48.0)
pH, Arterial: 7.381 (ref 7.350–7.450)
pO2, Arterial: 74.7 mmHg — ABNORMAL LOW (ref 83.0–108.0)

## 2018-11-24 LAB — BASIC METABOLIC PANEL
Anion gap: 9 (ref 5–15)
BUN: 11 mg/dL (ref 8–23)
CO2: 22 mmol/L (ref 22–32)
Calcium: 8.5 mg/dL — ABNORMAL LOW (ref 8.9–10.3)
Chloride: 106 mmol/L (ref 98–111)
Creatinine, Ser: 1.09 mg/dL (ref 0.61–1.24)
GFR calc Af Amer: >60 mL/min (ref >60)
GFR calc non Af Amer: >60 mL/min (ref >60)
Glucose, Bld: 79 mg/dL (ref 70–99)
Potassium: 4 mmol/L (ref 3.5–5.1)
Sodium: 137 mmol/L (ref 135–145)

## 2018-11-24 LAB — TYPE AND SCREEN
ABO/RH(D): O POS
Antibody Screen: NEGATIVE

## 2018-11-24 LAB — HEPARIN LEVEL (UNFRACTIONATED): Heparin Unfractionated: 0.29 IU/mL — ABNORMAL LOW (ref 0.30–0.70)

## 2018-11-24 LAB — LIPID PANEL
Cholesterol: 105 mg/dL (ref 0–200)
HDL: 47 mg/dL (ref >40)
LDL Cholesterol: 49 mg/dL (ref 0–99)
Total CHOL/HDL Ratio: 2.2 RATIO
Triglycerides: 44 mg/dL (ref <150)
VLDL: 9 mg/dL (ref 0–40)

## 2018-11-24 LAB — CBC
HCT: 36.7 % — ABNORMAL LOW (ref 39.0–52.0)
Hemoglobin: 11.8 g/dL — ABNORMAL LOW (ref 13.0–17.0)
MCH: 29.2 pg (ref 26.0–34.0)
MCHC: 32.2 g/dL (ref 30.0–36.0)
MCV: 90.8 fL (ref 80.0–100.0)
Platelets: 166 10*3/uL (ref 150–400)
RBC: 4.04 MIL/uL — ABNORMAL LOW (ref 4.22–5.81)
RDW: 13.5 % (ref 11.5–15.5)
WBC: 6.2 10*3/uL (ref 4.0–10.5)
nRBC: 0 % (ref 0.0–0.2)

## 2018-11-24 LAB — SURGICAL PCR SCREEN
MRSA, PCR: NEGATIVE
Staphylococcus aureus: NEGATIVE

## 2018-11-24 LAB — PROTIME-INR
INR: 1.2 (ref 0.8–1.2)
Prothrombin Time: 15.1 seconds (ref 11.4–15.2)

## 2018-11-24 SURGERY — THROMBECTOMY, ARTERY, ILIAC
Anesthesia: General | Site: Groin | Laterality: Left

## 2018-11-24 MED ORDER — ESMOLOL HCL 100 MG/10ML IV SOLN
INTRAVENOUS | Status: AC
  Filled 2018-11-24: qty 10

## 2018-11-24 MED ORDER — ACETAMINOPHEN 500 MG PO TABS
1000.0000 mg | ORAL_TABLET | Freq: Once | ORAL | Status: DC
  Filled 2018-11-24: qty 2

## 2018-11-24 MED ORDER — PHENOL 1.4 % MT LIQD: 1.0000 | OROMUCOSAL | Status: DC | PRN

## 2018-11-24 MED ORDER — GUAIFENESIN-DM 100-10 MG/5ML PO SYRP: 15.0000 mL | ORAL_SOLUTION | ORAL | Status: DC | PRN

## 2018-11-24 MED ORDER — LIDOCAINE 2% (20 MG/ML) 5 ML SYRINGE
INTRAMUSCULAR | Status: AC
  Filled 2018-11-24: qty 5

## 2018-11-24 MED ORDER — POLYETHYLENE GLYCOL 3350 17 G PO PACK: 17.0000 g | PACK | Freq: Every day | ORAL | Status: DC | PRN

## 2018-11-24 MED ORDER — HEPARIN SODIUM (PORCINE) 5000 UNIT/ML IJ SOLN
5000.0000 [IU] | Freq: Three times a day (TID) | INTRAMUSCULAR | Status: DC
  Administered 2018-11-25 – 2018-11-26 (×4): 5000 [IU] via SUBCUTANEOUS
  Filled 2018-11-24 (×5): qty 1

## 2018-11-24 MED ORDER — OXYCODONE-ACETAMINOPHEN 5-325 MG PO TABS
1.0000 | ORAL_TABLET | ORAL | Status: DC | PRN
  Administered 2018-11-24 – 2018-11-25 (×4): 2 via ORAL
  Filled 2018-11-24 (×4): qty 2

## 2018-11-24 MED ORDER — ONDANSETRON HCL 4 MG/2ML IJ SOLN: 4.0000 mg | Freq: Four times a day (QID) | INTRAMUSCULAR | Status: DC | PRN

## 2018-11-24 MED ORDER — PHENYLEPHRINE 40 MCG/ML (10ML) SYRINGE FOR IV PUSH (FOR BLOOD PRESSURE SUPPORT)
PREFILLED_SYRINGE | INTRAVENOUS | Status: DC | PRN
  Administered 2018-11-24: 80 ug via INTRAVENOUS
  Administered 2018-11-24: 120 ug via INTRAVENOUS
  Administered 2018-11-24: 80 ug via INTRAVENOUS

## 2018-11-24 MED ORDER — ALBUMIN HUMAN 5 % IV SOLN
INTRAVENOUS | Status: DC | PRN
  Administered 2018-11-24: 10:00:00 via INTRAVENOUS

## 2018-11-24 MED ORDER — ALUM & MAG HYDROXIDE-SIMETH 200-200-20 MG/5ML PO SUSP: 15.0000 mL | ORAL | Status: DC | PRN

## 2018-11-24 MED ORDER — ONDANSETRON HCL 4 MG/2ML IJ SOLN
INTRAMUSCULAR | Status: AC
  Filled 2018-11-24: qty 2

## 2018-11-24 MED ORDER — HYDRALAZINE HCL 20 MG/ML IJ SOLN: 5.0000 mg | INTRAMUSCULAR | Status: DC | PRN

## 2018-11-24 MED ORDER — PROTAMINE SULFATE 10 MG/ML IV SOLN
INTRAVENOUS | Status: DC | PRN
  Administered 2018-11-24 (×3): 20 mg via INTRAVENOUS
  Administered 2018-11-24: 30 mg via INTRAVENOUS
  Administered 2018-11-24 (×2): 20 mg via INTRAVENOUS

## 2018-11-24 MED ORDER — DOCUSATE SODIUM 100 MG PO CAPS
100.0000 mg | ORAL_CAPSULE | Freq: Every day | ORAL | Status: DC
  Administered 2018-11-26: 10:00:00 100 mg via ORAL
  Filled 2018-11-24 (×2): qty 1

## 2018-11-24 MED ORDER — LACTATED RINGERS IV SOLN
INTRAVENOUS | Status: DC | PRN
  Administered 2018-11-24: 08:00:00 via INTRAVENOUS

## 2018-11-24 MED ORDER — SODIUM CHLORIDE 0.9 % IV SOLN
INTRAVENOUS | Status: AC
  Filled 2018-11-24: qty 1.2

## 2018-11-24 MED ORDER — DEXAMETHASONE SODIUM PHOSPHATE 10 MG/ML IJ SOLN
INTRAMUSCULAR | Status: AC
  Filled 2018-11-24: qty 1

## 2018-11-24 MED ORDER — LACTATED RINGERS IV SOLN
INTRAVENOUS | Status: DC | PRN
  Administered 2018-11-24: 07:00:00 via INTRAVENOUS

## 2018-11-24 MED ORDER — HEPARIN (PORCINE) 25000 UT/250ML-% IV SOLN
INTRAVENOUS | Status: AC
  Filled 2018-11-24: qty 250

## 2018-11-24 MED ORDER — ACETAMINOPHEN 325 MG RE SUPP: 325.0000 mg | RECTAL | Status: DC | PRN

## 2018-11-24 MED ORDER — PROPOFOL 10 MG/ML IV BOLUS
INTRAVENOUS | Status: DC | PRN
  Administered 2018-11-24: 120 mg via INTRAVENOUS
  Administered 2018-11-24: 20 mg via INTRAVENOUS

## 2018-11-24 MED ORDER — ONDANSETRON HCL 4 MG/2ML IJ SOLN
INTRAMUSCULAR | Status: DC | PRN
  Administered 2018-11-24: 4 mg via INTRAVENOUS

## 2018-11-24 MED ORDER — ROCURONIUM BROMIDE 10 MG/ML (PF) SYRINGE
PREFILLED_SYRINGE | INTRAVENOUS | Status: DC | PRN
  Administered 2018-11-24: 20 mg via INTRAVENOUS
  Administered 2018-11-24 (×2): 30 mg via INTRAVENOUS
  Administered 2018-11-24: 50 mg via INTRAVENOUS

## 2018-11-24 MED ORDER — DEXAMETHASONE SODIUM PHOSPHATE 10 MG/ML IJ SOLN
INTRAMUSCULAR | Status: DC | PRN
  Administered 2018-11-24: 8 mg via INTRAVENOUS

## 2018-11-24 MED ORDER — ROCURONIUM BROMIDE 10 MG/ML (PF) SYRINGE
PREFILLED_SYRINGE | INTRAVENOUS | Status: AC
  Filled 2018-11-24: qty 10

## 2018-11-24 MED ORDER — HEPARIN SODIUM (PORCINE) 1000 UNIT/ML IJ SOLN
INTRAMUSCULAR | Status: AC
  Filled 2018-11-24: qty 2

## 2018-11-24 MED ORDER — ACETAMINOPHEN 325 MG PO TABS
ORAL_TABLET | ORAL | Status: DC | PRN
  Administered 2018-11-24: 1000 mg via ORAL

## 2018-11-24 MED ORDER — SODIUM CHLORIDE 0.9 % IV SOLN
INTRAVENOUS | Status: DC | PRN
  Administered 2018-11-24: 500 mL

## 2018-11-24 MED ORDER — LIDOCAINE 2% (20 MG/ML) 5 ML SYRINGE
INTRAMUSCULAR | Status: DC | PRN
  Administered 2018-11-24: 80 mg via INTRAVENOUS

## 2018-11-24 MED ORDER — LABETALOL HCL 5 MG/ML IV SOLN: 10.0000 mg | INTRAVENOUS | Status: DC | PRN

## 2018-11-24 MED ORDER — ACETAMINOPHEN 325 MG PO TABS: 325.0000 mg | ORAL_TABLET | ORAL | Status: DC | PRN

## 2018-11-24 MED ORDER — SUGAMMADEX SODIUM 200 MG/2ML IV SOLN
INTRAVENOUS | Status: DC | PRN
  Administered 2018-11-24: 200 mg via INTRAVENOUS

## 2018-11-24 MED ORDER — 0.9 % SODIUM CHLORIDE (POUR BTL) OPTIME
TOPICAL | Status: DC | PRN
  Administered 2018-11-24 (×2): 1000 mL

## 2018-11-24 MED ORDER — POTASSIUM CHLORIDE CRYS ER 20 MEQ PO TBCR: 20.0000 meq | EXTENDED_RELEASE_TABLET | Freq: Every day | ORAL | Status: DC | PRN

## 2018-11-24 MED ORDER — FENTANYL CITRATE (PF) 250 MCG/5ML IJ SOLN
INTRAMUSCULAR | Status: AC
  Filled 2018-11-24: qty 5

## 2018-11-24 MED ORDER — FENTANYL CITRATE (PF) 100 MCG/2ML IJ SOLN: 25.0000 ug | INTRAMUSCULAR | Status: DC | PRN

## 2018-11-24 MED ORDER — HEMOSTATIC AGENTS (NO CHARGE) OPTIME
TOPICAL | Status: DC | PRN
  Administered 2018-11-24: 1 via TOPICAL

## 2018-11-24 MED ORDER — CEFAZOLIN SODIUM-DEXTROSE 2-4 GM/100ML-% IV SOLN
2.0000 g | Freq: Three times a day (TID) | INTRAVENOUS | Status: AC
  Administered 2018-11-24 – 2018-11-25 (×2): 2 g via INTRAVENOUS
  Filled 2018-11-24 (×2): qty 100

## 2018-11-24 MED ORDER — PANTOPRAZOLE SODIUM 40 MG PO TBEC
40.0000 mg | DELAYED_RELEASE_TABLET | Freq: Every day | ORAL | Status: DC
  Administered 2018-11-24 – 2018-11-26 (×2): 40 mg via ORAL
  Filled 2018-11-24 (×3): qty 1

## 2018-11-24 MED ORDER — SODIUM CHLORIDE 0.9 % IV SOLN: 500.0000 mL | Freq: Once | INTRAVENOUS | Status: DC | PRN

## 2018-11-24 MED ORDER — SODIUM CHLORIDE 0.9 % IV SOLN
INTRAVENOUS | Status: DC
  Administered 2018-11-24: 15:00:00 via INTRAVENOUS

## 2018-11-24 MED ORDER — HEPARIN SODIUM (PORCINE) 1000 UNIT/ML IJ SOLN
INTRAMUSCULAR | Status: DC | PRN
  Administered 2018-11-24 (×2): 8000 [IU] via INTRAVENOUS

## 2018-11-24 MED ORDER — ONDANSETRON HCL 4 MG/2ML IJ SOLN: 4.0000 mg | Freq: Once | INTRAMUSCULAR | Status: DC | PRN

## 2018-11-24 MED ORDER — HYDROMORPHONE HCL 1 MG/ML IJ SOLN: 0.5000 mg | INTRAMUSCULAR | Status: DC | PRN

## 2018-11-24 MED ORDER — SODIUM CHLORIDE 0.9 % IV SOLN
INTRAVENOUS | Status: DC | PRN
  Administered 2018-11-24: 08:00:00 40 ug/min via INTRAVENOUS

## 2018-11-24 MED ORDER — FENTANYL CITRATE (PF) 250 MCG/5ML IJ SOLN
INTRAMUSCULAR | Status: DC | PRN
  Administered 2018-11-24: 25 ug via INTRAVENOUS
  Administered 2018-11-24: 50 ug via INTRAVENOUS
  Administered 2018-11-24: 100 ug via INTRAVENOUS

## 2018-11-24 MED ORDER — METOPROLOL TARTRATE 5 MG/5ML IV SOLN: 2.0000 mg | INTRAVENOUS | Status: DC | PRN

## 2018-11-24 MED ORDER — MAGNESIUM SULFATE 2 GM/50ML IV SOLN: 2.0000 g | Freq: Every day | INTRAVENOUS | Status: DC | PRN

## 2018-11-24 MED ORDER — ESMOLOL HCL 100 MG/10ML IV SOLN
INTRAVENOUS | Status: DC | PRN
  Administered 2018-11-24: 20 mg via INTRAVENOUS
  Administered 2018-11-24 (×2): 30 mg via INTRAVENOUS
  Administered 2018-11-24: 20 mg via INTRAVENOUS

## 2018-11-24 MED ORDER — PROPOFOL 10 MG/ML IV BOLUS
INTRAVENOUS | Status: AC
  Filled 2018-11-24: qty 20

## 2018-11-24 SURGICAL SUPPLY — 67 items
BAG ISOLATION DRAPE 18X18 (DRAPES) ×1 IMPLANT
BANDAGE ESMARK 6X9 LF (GAUZE/BANDAGES/DRESSINGS) IMPLANT
BIOPATCH RED 1 DISK 7.0 (GAUZE/BANDAGES/DRESSINGS) ×2 IMPLANT
BIOPATCH RED 1IN DISK 7.0MM (GAUZE/BANDAGES/DRESSINGS) ×1
BNDG ESMARK 6X9 LF (GAUZE/BANDAGES/DRESSINGS)
CANISTER SUCT 3000ML PPV (MISCELLANEOUS) ×4 IMPLANT
CANNULA VESSEL 3MM 2 BLNT TIP (CANNULA) ×8 IMPLANT
CATH EMB 4FR 40CM (CATHETERS) ×3 IMPLANT
CATH EMB 4FR 80CM (CATHETERS) ×3 IMPLANT
CATH EMB 5FR 80CM (CATHETERS) ×3 IMPLANT
CLIP VESOCCLUDE MED 24/CT (CLIP) ×4 IMPLANT
CLIP VESOCCLUDE SM WIDE 24/CT (CLIP) ×4 IMPLANT
CUFF TOURN SGL QUICK 24 (TOURNIQUET CUFF)
CUFF TOURN SGL QUICK 34 (TOURNIQUET CUFF)
CUFF TOURN SGL QUICK 42 (TOURNIQUET CUFF) IMPLANT
CUFF TRNQT CYL 24X4X16.5-23 (TOURNIQUET CUFF) IMPLANT
CUFF TRNQT CYL 34X4.125X (TOURNIQUET CUFF) IMPLANT
DERMABOND ADVANCED (GAUZE/BANDAGES/DRESSINGS) ×2
DERMABOND ADVANCED .7 DNX12 (GAUZE/BANDAGES/DRESSINGS) ×2 IMPLANT
DRAIN HEMOVAC 1/8 X 5 (WOUND CARE) ×3 IMPLANT
DRAPE HALF SHEET 40X57 (DRAPES) IMPLANT
DRAPE ISOLATION BAG 18X18 (DRAPES) ×2
ELECT REM PT RETURN 9FT ADLT (ELECTROSURGICAL) ×4
ELECTRODE REM PT RTRN 9FT ADLT (ELECTROSURGICAL) ×2 IMPLANT
EVACUATOR SILICONE 100CC (DRAIN) ×6 IMPLANT
GAUZE SPONGE 4X4 12PLY STRL LF (GAUZE/BANDAGES/DRESSINGS) ×3 IMPLANT
GLOVE BIO SURGEON STRL SZ 6 (GLOVE) ×3 IMPLANT
GLOVE BIO SURGEON STRL SZ 6.5 (GLOVE) ×8 IMPLANT
GLOVE BIO SURGEON STRL SZ7.5 (GLOVE) ×7 IMPLANT
GLOVE BIO SURGEONS STRL SZ 6.5 (GLOVE) ×4
GLOVE BIOGEL PI IND STRL 6 (GLOVE) ×1 IMPLANT
GLOVE BIOGEL PI INDICATOR 6 (GLOVE) ×2
GOWN STRL REUS W/ TWL LRG LVL3 (GOWN DISPOSABLE) ×6 IMPLANT
GOWN STRL REUS W/TWL LRG LVL3 (GOWN DISPOSABLE) ×6
GOWN STRL REUS W/TWL XL LVL3 (GOWN DISPOSABLE) ×3 IMPLANT
HEMASHIELD FINESSE CARDIO (Vascular Products) ×4 IMPLANT
HEMOSTAT SPONGE AVITENE ULTRA (HEMOSTASIS) ×3 IMPLANT
KIT BASIN OR (CUSTOM PROCEDURE TRAY) ×4 IMPLANT
KIT TURNOVER KIT B (KITS) ×4 IMPLANT
NS IRRIG 1000ML POUR BTL (IV SOLUTION) ×8 IMPLANT
PACK PERIPHERAL VASCULAR (CUSTOM PROCEDURE TRAY) ×4 IMPLANT
PATCH HEMASHIELD FINESS CARDIO (Vascular Products) ×1 IMPLANT
PENCIL BUTTON HOLSTER BLD 10FT (ELECTRODE) ×3 IMPLANT
SET COLLECT BLD 21X3/4 12 (NEEDLE) IMPLANT
STOPCOCK 4 WAY LG BORE MALE ST (IV SETS) ×3 IMPLANT
SUT ETHILON 3 0 PS 1 (SUTURE) ×3 IMPLANT
SUT PROLENE 5 0 C 1 24 (SUTURE) ×10 IMPLANT
SUT PROLENE 6 0 CC (SUTURE) ×13 IMPLANT
SUT PROLENE 7 0 BV 1 (SUTURE) IMPLANT
SUT PROLENE 7 0 BV1 MDA (SUTURE) IMPLANT
SUT SILK 2 0 SH (SUTURE) ×1 IMPLANT
SUT SILK 3 0 (SUTURE)
SUT SILK 3-0 18XBRD TIE 12 (SUTURE) IMPLANT
SUT VIC AB 2-0 CT1 27 (SUTURE) ×2
SUT VIC AB 2-0 CT1 TAPERPNT 27 (SUTURE) ×1 IMPLANT
SUT VIC AB 2-0 SH 27 (SUTURE) ×4
SUT VIC AB 2-0 SH 27XBRD (SUTURE) ×4 IMPLANT
SUT VIC AB 3-0 SH 27 (SUTURE)
SUT VIC AB 3-0 SH 27X BRD (SUTURE) ×4 IMPLANT
SUT VIC AB 4-0 PS2 27 (SUTURE) ×5 IMPLANT
TAPE CLOTH SURG 6X10 WHT LF (GAUZE/BANDAGES/DRESSINGS) ×3 IMPLANT
TAPE UMBILICAL COTTON 1/8X30 (MISCELLANEOUS) ×4 IMPLANT
TOWEL GREEN STERILE (TOWEL DISPOSABLE) ×4 IMPLANT
TRAY FOLEY MTR SLVR 16FR STAT (SET/KITS/TRAYS/PACK) ×4 IMPLANT
TUBING EXTENTION W/L.L. (IV SETS) IMPLANT
UNDERPAD 30X30 (UNDERPADS AND DIAPERS) ×4 IMPLANT
WATER STERILE IRR 1000ML POUR (IV SOLUTION) ×4 IMPLANT

## 2018-11-24 NOTE — Progress Notes (Signed)
PT Cancellation Note  Patient Details Name: Frank Arias MRN: [id number] DOB: [birth year]   Cancelled Treatment:    Reason Eval/Treat Not Completed: Medical issues which prohibited therapy(Pt just out of surgery. Will check back tomorrow.  )   Denice Paradise 11/24/2018, 3:21 PM Nicoles Sedlacek,PT Acute Rehabilitation Services Pager:  [phone]  Office:  [phone]

## 2018-11-24 NOTE — Anesthesia Procedure Notes (Signed)
Procedure Name: Intubation Performed by: Milford Cage, CRNA Pre-anesthesia Checklist: Patient identified, Emergency Drugs available, Suction available and Patient being monitored Patient Re-evaluated:Patient Re-evaluated prior to induction Oxygen Delivery Method: Circle System Utilized Preoxygenation: Pre-oxygenation with 100% oxygen Induction Type: IV induction Ventilation: Mask ventilation without difficulty and Oral airway inserted - appropriate to patient size Laryngoscope Size: Mac and 3 Grade View: Grade I Tube type: Oral Number of attempts: 1 Airway Equipment and Method: Stylet and Oral airway Placement Confirmation: ETT inserted through vocal cords under direct vision,  positive ETCO2 and breath sounds checked- equal and bilateral Secured at: 21 cm Tube secured with: Tape Dental Injury: Teeth and Oropharynx as per pre-operative assessment

## 2018-11-24 NOTE — Progress Notes (Signed)
Pt reported to nurse difficulty breathing. Lung sounds rhonchorous and diminished. O2 sat 94 on 2L [place]. Breathing appears unlabored and regular. Pt in a fib with rate between 100's to 120's. Anesthesiologist notified and en route at 1225. At 1235, anesthesiologist at bedside. Assessed pt and no new orders. Pt reports breathing is easier at this time.

## 2018-11-24 NOTE — Anesthesia Procedure Notes (Addendum)
Arterial Line Insertion Start/End8/01/2019 6:55 AM, 11/24/2018 7:05 AM Performed by: Catalina Gravel, MD, Milford Cage, CRNA, CRNA  Patient location: Pre-op. Preanesthetic checklist: patient identified, IV checked, site marked, risks and benefits discussed, surgical consent, monitors and equipment checked, pre-op evaluation, timeout performed and anesthesia consent Lidocaine 1% used for infiltration Left, radial was placed Catheter size: 20 G Hand hygiene performed , maximum sterile barriers used  and Seldinger technique used Allen's test indicative of satisfactory collateral circulation Attempts: 1 Procedure performed without using ultrasound guided technique. Following insertion, dressing applied. Post procedure assessment: normal and unchanged  Patient tolerated the procedure well with no immediate complications.

## 2018-11-24 NOTE — Interval H&P Note (Signed)
History and Physical Interval Note:  11/24/2018 7:23 AM  Frank Arias  has presented today for surgery, with the diagnosis of OCCLUDED LIMB OF GRAFT.  The various methods of treatment have been discussed with the patient and family. After consideration of risks, benefits and other options for treatment, the patient has consented to  Procedure(s): THROMBECTOMY LEFT LIMB OF AORTOBIFEMORAL GRAFT (Left) ENDARTERECTOMY FEMORAL WITH PROFUNDOPLASTY (Left) BYPASS GRAFT FEMORAL-POPLITEAL ARTERY (Left) as a surgical intervention.  The patient's history has been reviewed, patient examined, no change in status, stable for surgery.  I have reviewed the patient's chart and labs.  Questions were answered to the patient's satisfaction.     Ruta Hinds

## 2018-11-24 NOTE — Progress Notes (Signed)
Foley catheter removed per patient request and MD order.  Will continue to monitor.

## 2018-11-24 NOTE — Anesthesia Postprocedure Evaluation (Signed)
Anesthesia Post Note  Patient: Ardine Bjork  Procedure(s) Performed: THROMBECTOMY LEFT LIMB OF AORTOBIFEMORAL GRAFT (Left Groin) ENDARTERECTOMY FEMORAL WITH PROFUNDOPLASTY (Left Groin) Patch Angioplasty Left Common Femoral Artery (Left Groin)     Patient location during evaluation: PACU Anesthesia Type: General Level of consciousness: awake and alert Pain management: pain level controlled Vital Signs Assessment: post-procedure vital signs reviewed and stable Respiratory status: spontaneous breathing, nonlabored ventilation and respiratory function stable Cardiovascular status: blood pressure returned to baseline and stable Postop Assessment: no apparent nausea or vomiting Anesthetic complications: no    Last Vitals:  Vitals:   11/24/18 1310 11/24/18 1320  BP:  111/70  Pulse: (!) 111 (!) 111  Resp: (!) 21 12  Temp:  36.7 C  SpO2: 96% 94%    Last Pain:  Vitals:   11/24/18 1320  TempSrc: Oral  PainSc:                  Catalina Gravel

## 2018-11-24 NOTE — Transfer of Care (Signed)
Immediate Anesthesia Transfer of Care Note  Patient: Frank Arias  Procedure(s) Performed: THROMBECTOMY LEFT LIMB OF AORTOBIFEMORAL GRAFT (Left Groin) ENDARTERECTOMY FEMORAL WITH PROFUNDOPLASTY (Left Groin) Patch Angioplasty Left Common Femoral Artery (Left Groin)  Patient Location: PACU  Anesthesia Type:General  Level of Consciousness: awake  Airway & Oxygen Therapy: Patient Spontanous Breathing and Patient connected to face mask oxygen  Post-op Assessment: Report given to RN and Post -op Vital signs reviewed and stable  Post vital signs: Reviewed and stable  Last Vitals:  Vitals Value Taken Time  BP 106/70 11/24/18 1210  Temp    Pulse 110 11/24/18 1212  Resp 21 11/24/18 1212  SpO2 92 % 11/24/18 1212  Vitals shown include unvalidated device data.  Last Pain:  Vitals:   11/24/18 0511  TempSrc: Oral  PainSc:       Patients Stated Pain Goal: 0 (79/39/03 0092)  Complications: No apparent anesthesia complications

## 2018-11-24 NOTE — Op Note (Signed)
Procedure: Thrombectomy left limb aortobifemoral bypass, left common femoral endarterectomy with profundoplasty, patch angioplasty left common femoral superficial femoral profunda femoris artery (Dacron)  Preoperative diagnosis: Left foot ischemia  Postoperative diagnosis: Same  Anesthesia: General  Assistant: Arlee Muslim, PA-C  Operative findings: #1 chronic occlusion of left femoropopliteal vein bypass graft  2.  Laminated thrombus and recurrent atherosclerotic plaque left common femoral profunda femoris artery  Operative details: After obtaining form consent, the patient taken the operating.  The patient placed in supine discomforting table.  After induction general anesthesia and endotracheal intubation, Foley cath was placed.  Next patient was prepped and draped in usual sterile fashion from the umbilicus down to the toes bilaterally.  Longitudinal incision was made in the left groin through pre-existing scar.  This was carried on the subcutaneous tissues down the level of a pre-existing femoropopliteal bypass.  This was firm and occluded.  Dissection was carried down the level of the left common femoral artery.  The dacryon graft from the aortobifem was identified and dissection was carried up to the level of the inguinal ligament.  The anterior two thirds surface of the dacryon graft was dissected free but the posterior aspect was fairly stuck to the distal external iliac and common femoral artery so this was left in place.  Dissection was carried down to the native superficial femoral artery and profunda femoris arteries.  There were 2 large profunda branches.  One was medial and lateral.  The lateral 1 had a large amount of calcified plaque within it.  I dissected this down to the secondary branches and Vesseloops were placed around all of these in order to give [place] room for endarterectomy.  The superficial femoral artery had no pulse within it.  The left limb of the aortobifem did have a  pulse above the inguinal ligament.  Scar tissue was fairly dense with the common femoral vein adherent to the medial aspect of the artery and graft as well.  Several centimeters of the vein bypass were also dissected free circumferentially and a vessel loop placed around this.  Patient was given 8000 units of intravenous heparin.  He was given an additional 8000 units of heparin during the course of the case.  A longitudinal opening was made in the hood of the aortobifemoral bypass graft.  There was chronic laminated thrombus within this.  The arteriotomy was extended all the way down into the proximal superficial femoral artery.  All of the loose thrombus was removed.  There was also exophytic calcified plaque along the posterior wall of the artery.  An endarterectomy of this was performed and carried all the way down into the proximal superficial femoral artery about 2 cm.  A reasonable endpoint was obtained in the superficial femoral artery with the artery was very diseased distally.  The femoropopliteal bypass also had chronic thrombus within it.  I was unable to pass a Fogarty even 1 cm into this due to the chronic nature.  This was ligated with a 2-0 silk tie.  I then inspected the large profunda branches.  There was a medial branch [place] which had very brisk backbleeding.  There was a lateral branch which did have some backbleeding but not as brisk and there was a large amount of plaque within this.  The arteriotomy was extended into this lateral branch so that I could perform an endarterectomy down to the secondary branches.  After the recurrent plaque and thrombus have been removed from the common femoral artery there was  vigorous backbleeding from the medial profunda branch.  At this point a #5 Fogarty catheter was used to thrombectomize the left limb of the aortobifemoral bypass graft.  Excellent arterial inflow was obtained.  This was controlled with a peripheral DeBakey clamp.  The native proximal  common femoral/external iliac artery did not have any antegrade flow.  All loose debris was removed from the artery.  A Dacron patch was then brought up in the operative field and sewn on as a bifid distal end with about 2 cm of it extending onto the superficial femoral artery and 2 cm of it extending down onto the lateral profunda branch.  This was done with a running 6-0 and 5-0 Prolene suture.  Despite completion anastomosis it was for blood backbled and thoroughly flushed reanastomosed was secured clamps released there is good pulsatile flow in the repaired area immediately.  There was good Doppler flow in the profunda branches and there was fairly obstructive flow but some flow in the proximal superficial femoral artery.  Hemostasis was obtained with the addition of a few pledgeted repair stitches as well as administration of 130 mg of protamine and placing Avitene on the suture line.  After hemostasis was obtained a 10 flat Jackson-Pratt drain was brought out through a separate stab incision on the anterior thigh.  This was sutured the skin with a nylon suture.  The groin was then closed in multiple layers using a running 2-0 and 3-0 Vicryl suture and a 4-0 Vicryl subcuticular stitch in the skin.  Dermabond was applied to the skin.  The patient tolerated procedure well and there were no complications.  The instrument sponge and needle count was correct the end of the case.  The patient was taken to the recovery room in stable condition.  Patient had a good posterior tibial Doppler signal at the conclusion of the case.  I was unable to find a dorsalis pedis or peroneal Doppler signal.  The patient had no Doppler signals preoperatively.  Ruta Hinds, MD [facility] Office: [phone] Pager: [phone]

## 2018-11-25 ENCOUNTER — Encounter (HOSPITAL_COMMUNITY): Payer: Self-pay | Admitting: Vascular Surgery

## 2018-11-25 LAB — CBC
HCT: 29 % — ABNORMAL LOW (ref 39.0–52.0)
Hemoglobin: 9.4 g/dL — ABNORMAL LOW (ref 13.0–17.0)
MCH: 29.3 pg (ref 26.0–34.0)
MCHC: 32.4 g/dL (ref 30.0–36.0)
MCV: 90.3 fL (ref 80.0–100.0)
Platelets: 122 10*3/uL — ABNORMAL LOW (ref 150–400)
RBC: 3.21 MIL/uL — ABNORMAL LOW (ref 4.22–5.81)
RDW: 13.7 % (ref 11.5–15.5)
WBC: 7.8 10*3/uL (ref 4.0–10.5)
nRBC: 0 % (ref 0.0–0.2)

## 2018-11-25 LAB — POCT I-STAT 4, (NA,K, GLUC, HGB,HCT)
Glucose, Bld: 104 mg/dL — ABNORMAL HIGH (ref 70–99)
HCT: 31 % — ABNORMAL LOW (ref 39.0–52.0)
Hemoglobin: 10.5 g/dL — ABNORMAL LOW (ref 13.0–17.0)
Potassium: 4.2 mmol/L (ref 3.5–5.1)
Sodium: 139 mmol/L (ref 135–145)

## 2018-11-25 LAB — BASIC METABOLIC PANEL
Anion gap: 8 (ref 5–15)
BUN: 12 mg/dL (ref 8–23)
CO2: 20 mmol/L — ABNORMAL LOW (ref 22–32)
Calcium: 8 mg/dL — ABNORMAL LOW (ref 8.9–10.3)
Chloride: 110 mmol/L (ref 98–111)
Creatinine, Ser: 0.93 mg/dL (ref 0.61–1.24)
GFR calc Af Amer: >60 mL/min (ref >60)
GFR calc non Af Amer: >60 mL/min (ref >60)
Glucose, Bld: 134 mg/dL — ABNORMAL HIGH (ref 70–99)
Potassium: 4 mmol/L (ref 3.5–5.1)
Sodium: 138 mmol/L (ref 135–145)

## 2018-11-25 NOTE — Progress Notes (Addendum)
  Progress Note    11/25/2018 7:19 AM 1 Day Post-Op  Subjective:  Wants to go home today   Vitals:   11/24/18 1950 11/25/18 0442  BP: (!) 93/58 95/63  Pulse:    Resp: 15 15  Temp:  (!) 97.5 F (36.4 C)  SpO2:  97%   Physical Exam: Lungs:  Non labored Incisions:  L groin incision c/d/i; 35cc JP drain output overnight Extremities:  Brisk L PT by doppler Abdomen:  Soft Neurologic: A&O  CBC    Component Value Date/Time   WBC 7.8 11/25/2018 0445   RBC 3.21 (L) 11/25/2018 0445   HGB 9.4 (L) 11/25/2018 0445   HCT 29.0 (L) 11/25/2018 0445   PLT 122 (L) 11/25/2018 0445   MCV 90.3 11/25/2018 0445   MCH 29.3 11/25/2018 0445   MCHC 32.4 11/25/2018 0445   RDW 13.7 11/25/2018 0445    BMET    Component Value Date/Time   NA 138 11/25/2018 0445   K 4.0 11/25/2018 0445   CL 110 11/25/2018 0445   CO2 20 (L) 11/25/2018 0445   GLUCOSE 134 (H) 11/25/2018 0445   BUN 12 11/25/2018 0445   CREATININE 0.93 11/25/2018 0445   CALCIUM 8.0 (L) 11/25/2018 0445   GFRNONAA >60 11/25/2018 0445   GFRAA >60 11/25/2018 0445    INR    Component Value Date/Time   INR 1.2 11/24/2018 0338     Intake/Output Summary (Last 24 hours) at 11/25/2018 0719 Last data filed at 11/25/2018 0554 Gross per 24 hour  Intake 3130.48 ml  Output 2685 ml  Net 445.48 ml     Assessment/Plan:  78 y.o. male is s/p L CFA endart and profundoplasty 1 Day Post-Op   Perfusing LLE well with brisk PT signal by doppler D/c JP drain; 35cc overnight Ambulate this morning Possible discharge this afternoon   Dagoberto Ligas, PA-C [facility] [phone] 11/25/2018 7:19 AM  Agree with above.  D.c drain PT doppler and peroneal Check ABI today D/c tomorrow and start [place] asst  Ruta Hinds, MD [facility] [place] Office: [phone] Pager: [phone]

## 2018-11-25 NOTE — Care Management Important Message (Signed)
Important Message  Patient Details  Name: Frank Arias MRN: [id number] Date of Birth: Mar 07, 1941   Medicare Important Message Given:  Yes     Memory Argue 11/25/2018, 11:51 AM

## 2018-11-25 NOTE — Progress Notes (Signed)
Pt ambulated in hallway without assistive device (49ft) and tolerated well. Pt back in the bed sating at 97%. Call light within reach.

## 2018-11-25 NOTE — Progress Notes (Addendum)
Progress Note  Patient Name: Frank Arias Date of Encounter: 11/25/2018  Primary Cardiologist: Fransico Him, MD   Subjective   No complaints. Denies CP and dyspnea. No leg pain. Seems to be in good spirit. Joking with nurses and staff.   Inpatient Medications    Scheduled Meds: . amiodarone  400 mg Oral BID  . aspirin  81 mg Oral Daily  . atorvastatin  10 mg Oral Daily  . docusate sodium  100 mg Oral Daily  . heparin  5,000 Units Subcutaneous Q8H  . metoprolol tartrate  12.5 mg Oral BID  . mupirocin ointment  1 application Nasal BID  . pantoprazole  40 mg Oral Daily   Continuous Infusions: . sodium chloride    . magnesium sulfate bolus IVPB     PRN Meds: sodium chloride, acetaminophen **OR** acetaminophen, alum & mag hydroxide-simeth, diphenhydrAMINE, guaiFENesin-dextromethorphan, hydrALAZINE, HYDROmorphone (DILAUDID) injection, labetalol, magnesium sulfate bolus IVPB, metoprolol tartrate, ondansetron, oxyCODONE-acetaminophen, phenol, polyethylene glycol, potassium chloride   Vital Signs    Vitals:   11/24/18 1943 11/24/18 1950 11/25/18 0442 11/25/18 0823  BP: (!) 93/58 (!) 93/58 95/63 100/67  Pulse: 73     Resp: 13 15 15 15   Temp: 98.2 F (36.8 C)  (!) 97.5 F (36.4 C) 98.1 F (36.7 C)  TempSrc: Oral  Oral Oral  SpO2: 97%  97% 97%  Weight:      Height:        Intake/Output Summary (Last 24 hours) at 11/25/2018 0847 Last data filed at 11/25/2018 4132 Gross per 24 hour  Intake 3130.48 ml  Output 2485 ml  Net 645.48 ml   Last 3 Weights 11/20/2018 11/20/2018 06/23/2018  Weight (lbs) 170 lb 170 lb 169 lb 9.6 oz  Weight (kg) 77.111 kg 77.111 kg 76.93 kg      Telemetry    NSR 70s-80s - Personally Reviewed  ECG    No new EKG to review  - Personally Reviewed  Physical Exam   GEN: Elderly, pleasant, WM in No acute distress.   Neck: No JVD Cardiac: RRR, low pitch 2/6 murmur at the apex   Respiratory: Clear to auscultation bilaterally. GI: Soft, nontender,  non-distended  MS: No edema; No deformity. Neuro:  Nonfocal  Psych: Normal affect   Labs    High Sensitivity Troponin:  No results for input(s): TROPONINIHS in the last 720 hours.    Cardiac EnzymesNo results for input(s): TROPONINI in the last 168 hours. No results for input(s): TROPIPOC in the last 168 hours.   Chemistry Recent Labs  Lab 11/20/18 2243 11/21/18 0349 11/24/18 0338 11/24/18 1031 11/25/18 0445  NA 139 138 137 139 138  K 4.0 4.1 4.0 4.2 4.0  CL 105 106 106  --  110  CO2 24 23 22   --  20*  GLUCOSE 94 100* 79 104* 134*  BUN 21 19 11   --  12  CREATININE 1.06 1.01 1.09  --  0.93  CALCIUM 9.0 8.7* 8.5*  --  8.0*  PROT 7.5  --   --   --   --   ALBUMIN 3.7  --   --   --   --   AST 31  --   --   --   --   ALT 20  --   --   --   --   ALKPHOS 110  --   --   --   --   BILITOT 0.3  --   --   --   --  GFRNONAA >60 >60 >60  --  >60  GFRAA >60 >60 >60  --  >60  ANIONGAP 10 9 9   --  8     Hematology Recent Labs  Lab 11/23/18 0720 11/24/18 0338 11/24/18 1031 11/25/18 0445  WBC 6.1 6.2  --  7.8  RBC 4.14* 4.04*  --  3.21*  HGB 11.8* 11.8* 10.5* 9.4*  HCT 38.0* 36.7* 31.0* 29.0*  MCV 91.8 90.8  --  90.3  MCH 28.5 29.2  --  29.3  MCHC 31.1 32.2  --  32.4  RDW 13.7 13.5  --  13.7  PLT 170 166  --  122*    BNPNo results for input(s): BNP, PROBNP in the last 168 hours.   DDimer No results for input(s): DDIMER in the last 168 hours.   Radiology    Dg Chest Port 1 View  Result Date: 11/24/2018 CLINICAL DATA:  Postop check. EXAM: PORTABLE CHEST 1 VIEW COMPARISON:  None. FINDINGS: The heart size and mediastinal contours are within normal limits. No pneumothorax or pleural effusion is noted. Minimal bibasilar subsegmental atelectasis is noted. Status post coronary bypass graft. The visualized skeletal structures are unremarkable. IMPRESSION: Minimal bibasilar subsegmental atelectasis. Aortic Atherosclerosis (ICD10-I70.0). Electronically Signed   By: Marijo Conception  M.D.   On: 11/24/2018 14:59    Cardiac Studies   Nuclear stress test 11/20/18  Study Result    Defect 1: There is a large defect of severe severity present in the basal anteroseptal, basal inferoseptal, basal inferior, basal inferolateral, mid inferoseptal, mid inferior, mid inferolateral, apical inferior and apex location.  Findings consistent with prior myocardial infarction with peri-infarct ischemia.  The left ventricular ejection fraction is moderately decreased (30-44%).  Change compared to prior study.  There is a large, severe defect across the entire septum and inferior wall to apex. On stress imaging, there is a partially reversible section inferolaterally and towards the apex. Overall it is a small proportion of the entire defect that is reversible. This is consistent with prior infarct with mild peri-infarct ischemia.   2D Echo 11/21/18 IMPRESSIONS    1. The left ventricle is mildly dilated. The left ventricle has mild-moderately reduced systolic function, with an ejection fraction of 40-45%. Left ventricular diastolic Doppler parameters are consistent with indeterminate diastolic dysfunction. Basal  to mid inferior akinesis, basal inferoseptal hypokinesis.  2. There is mild mitral annular calcification present. No evidence of mitral valve stenosis. Mild mitral regurgitation.  3. The aortic valve is tricuspid Aortic valve regurgitation is trivial by color flow Doppler. No stenosis of the aortic valve.  4. The right ventricle has mildly reduced systolic function. The cavity was mildly enlarged. There is no increase in right ventricular wall thickness.  5. Left atrial size was mildly dilated.  6. Right atrial size was mildly dilated.  7. Limited echo.  Patient Profile     78 y.o. male with hx of CAD with CABG 2001 (LIMA to the LAD; SVG to RI; SVG to left Cx and SVG to PDA), Dizziness, Hyperlipidemia, Hypertension,? PossiblePFO diagnosed in 2018(negative bubble study  10/2018), PVD with AAA repair and LLE fem bypass in 2001 and then s/p right CFA endarterectomy and profundoplasty by Dr. Oneida Alar in 2018.  Admitted with claudication in his LLE and dusky toes and required LE bypass surgery. Cardiology was consulted for preop cardiac clearance and for atrial fibrillation.    Assessment & Plan    1. PVD/ Critical Limb Ischemia: POD#1 s/p Thrombectomy left limb aortobifemoral bypass,  left common femoral endarterectomy with profundoplasty, patch angioplasty left common femoral superficial femoral profunda femoris artery (Dacron). Management per vascular surgery.   2. Cardiac: doing well post op. No CP or dyspnea. VSS. Euvolemic on exam. NSR. HR in the 70s. Continue current cardiac medications. No changes.   3. Atrial Fibrillation: New diagnosis. He is now in NSR. HR in the 70s. Continue Amiodarone loading. Currently on 400 mg BID. Will need gradual titration down to matainence dose of 200 mg daily over the next several weeks. CHA2DS2 VASc score is 5. Once ok to start anticoagulation, from a surgical standpoint, he will need a DOAC for stroke prophylaxis.   4. Anemia: Hgb drom from 10.5>>9.4, post op. Monitor.   5. CAD: s/p remote CABG. Stable w/o CP. Continue medical therapy. ASA, statin and  blocker  6. Systolic HF: preoperative echocardiogram 11/21/18 showed mild-moderately reduced LVEF at 40-45%. ? If tachy mediated due to recent rapid atrial fibrillation. He does have known CAD but no recent anginal symptoms. He appears euvolemic on exam and he has no dyspnea. This can be followed by Dr. Radford Pax in clinic. Medical management may be limited by soft BP. No room to add an ACE/ARB at this time. He is currently on short acting metoprolol. BP is soft but will monitor on 12.5 mg dose. If he continues to tolerate, would recommend eventual change from metoprolol tartrate to metoprolol succinate, approved for the management of chronic systolic HF. Monitor volume status closely.     For questions or updates, please contact [place] Please consult [email or web address] for contact info under      Signed, Lyda Jester, PA-C  11/25/2018, 8:47 AM      Patient seen and examined. Agree with assessment and plan. S/P vascular surgery yesterday with thrombectomy left limb aortobifemoral bypass, left common femoral endarterectomy with profundoplasty, patch angioplasty left common femoral superficial femoral profunda femoris artery by Dr Oneida Alar.  Maintaining sinus rhythm on amiodarone load. Will check post op ECG today. BP stable but soft at 100/67.    Troy Sine, MD, [facility] 11/25/2018 10:26 AM

## 2018-11-26 ENCOUNTER — Inpatient Hospital Stay (HOSPITAL_COMMUNITY): Payer: Medicare HMO

## 2018-11-26 DIAGNOSIS — I739 Peripheral vascular disease, unspecified: Secondary | ICD-10-CM

## 2018-11-26 MED ORDER — RIVAROXABAN (XARELTO) VTE STARTER PACK (15 & 20 MG): ORAL_TABLET | ORAL | 0 refills | Status: DC

## 2018-11-26 MED ORDER — OXYCODONE-ACETAMINOPHEN 5-325 MG PO TABS: 1.0000 | ORAL_TABLET | Freq: Four times a day (QID) | ORAL | 0 refills | Status: DC | PRN

## 2018-11-26 MED ORDER — RIVAROXABAN 15 MG PO TABS: 15.0000 mg | ORAL_TABLET | Freq: Once | ORAL | Status: DC

## 2018-11-26 MED ORDER — METOPROLOL TARTRATE 25 MG PO TABS: 12.5000 mg | ORAL_TABLET | Freq: Two times a day (BID) | ORAL | 0 refills | Status: DC

## 2018-11-26 MED ORDER — AMIODARONE HCL 400 MG PO TABS: 400.0000 mg | ORAL_TABLET | Freq: Two times a day (BID) | ORAL | 0 refills | Status: DC

## 2018-11-26 NOTE — Progress Notes (Signed)
ABI completed. Refer to "CV Proc" under chart review to view preliminary results.  11/26/2018 11:11 AM Maudry Mayhew, MHA, RVT, RDCS, RDMS

## 2018-11-26 NOTE — Discharge Instructions (Signed)
 [facility]  Discharge instructions  Lower Extremity Bypass Surgery  Please refer to the following instruction for your post-procedure care. Your surgeon or physician assistant will discuss any changes with you.  Activity  You are encouraged to walk as much as you can. You can slowly return to normal activities during the month after your surgery. Avoid strenuous activity and heavy lifting until your doctor tells you it's OK. Avoid activities such as vacuuming or swinging a golf club. Do not drive until your doctor give the OK and you are no longer taking prescription pain medications. It is also normal to have difficulty with sleep habits, eating and bowel movement after surgery. These will go away with time.  Bathing/Showering  You may shower after you go home. Do not soak in a bathtub, hot tub, or swim until the incision heals completely.  Incision Care  Clean your incision with mild soap and water. Shower every day. Pat the area dry with a clean towel. You do not need a bandage unless otherwise instructed. Do not apply any ointments or creams to your incision. If you have open wounds you will be instructed how to care for them or a visiting nurse may be arranged for you. If you have staples or sutures along your incision they will be removed at your post-op appointment. You may have skin glue on your incision. Do not peel it off. It will come off on its own in about one week. If you have a great deal of moisture in your groin, use a gauze help keep this area dry.  Diet  Resume your normal diet. There are no special food restrictions following this procedure. A low fat/ low cholesterol diet is recommended for all patients with vascular disease. In order to heal from your surgery, it is CRITICAL to get adequate nutrition. Your body requires vitamins, minerals, and protein. Vegetables are the best source of vitamins and minerals. Vegetables also provide the  perfect balance of protein. Processed food has little nutritional value, so try to avoid this.  Medications  Resume taking all your medications unless your doctor or nurse practitioner tells you not to. If your incision is causing pain, you may take over-the-counter pain relievers such as acetaminophen (Tylenol). If you were prescribed a stronger pain medication, please aware these medication can cause nausea and constipation. Prevent nausea by taking the medication with a snack or meal. Avoid constipation by drinking plenty of fluids and eating foods with high amount of fiber, such as fruits, vegetables, and grains. Take Colase 100 mg (an over-the-counter stool softener) twice a day as needed for constipation. Do not take Tylenol if you are taking prescription pain medications.  Follow Up  Our office will schedule a follow up appointment 2-3 weeks following discharge.  Please call us immediately for any of the following conditions  Severe or worsening pain in your legs or feet while at rest or while walking Increase pain, redness, warmth, or drainage (pus) from your incision site(s) Fever of 101 degree or higher The swelling in your leg with the bypass suddenly worsens and becomes more painful than when you were in the hospital If you have been instructed to feel your graft pulse then you should do so every day. If you can no longer feel this pulse, call the office immediately. Not all patients are given this instruction.  Leg swelling is common after leg bypass surgery.  The swelling should improve over a few months   following surgery. To improve the swelling, you may elevate your legs above the level of your heart while you are sitting or resting. Your surgeon or physician assistant may ask you to apply an ACE wrap or wear compression (TED) stockings to help to reduce swelling.  Reduce your risk of vascular disease  Stop smoking. If you would like help call [facility] at 1-800-QUIT-NOW  (1-800-784-8669) or [place] at 336-586-4000.  Manage your cholesterol Maintain a desired weight Control your diabetes weight Control your diabetes Keep your blood pressure down  If you have any questions, please call the office at [phone]   

## 2018-11-26 NOTE — Progress Notes (Signed)
Pt provided disharge instructions and education. Pt iv removed and intact. Telebox removed. CCMD notified. Pt has all belongings. Vitals stable. Pt denies complaints. Pt tx via wheelcahir to valet for ride. Jerald Kief, RN

## 2018-11-26 NOTE — Progress Notes (Addendum)
Progress Note  Patient Name: Frank Arias Date of Encounter: 11/26/2018  Primary Cardiologist: Fransico Him, MD   Subjective   No complaints. No leg pain. No cardiac symptoms. Ready to go home.   Inpatient Medications    Scheduled Meds: . amiodarone  400 mg Oral BID  . aspirin  81 mg Oral Daily  . atorvastatin  10 mg Oral Daily  . docusate sodium  100 mg Oral Daily  . heparin  5,000 Units Subcutaneous Q8H  . metoprolol tartrate  12.5 mg Oral BID  . mupirocin ointment  1 application Nasal BID  . pantoprazole  40 mg Oral Daily   Continuous Infusions: . sodium chloride    . magnesium sulfate bolus IVPB     PRN Meds: sodium chloride, acetaminophen **OR** acetaminophen, alum & mag hydroxide-simeth, diphenhydrAMINE, guaiFENesin-dextromethorphan, hydrALAZINE, HYDROmorphone (DILAUDID) injection, labetalol, magnesium sulfate bolus IVPB, metoprolol tartrate, ondansetron, oxyCODONE-acetaminophen, phenol, polyethylene glycol, potassium chloride   Vital Signs    Vitals:   11/25/18 2003 11/26/18 0420 11/26/18 0819 11/26/18 0947  BP: 112/71 112/73 114/69   Pulse:   71 73  Resp: 11 12 19    Temp: 98.3 F (36.8 C) 98.7 F (37.1 C) 98.4 F (36.9 C)   TempSrc: Oral Oral Oral   SpO2: 95% 94% 95%   Weight:      Height:        Intake/Output Summary (Last 24 hours) at 11/26/2018 1007 Last data filed at 11/25/2018 1844 Gross per 24 hour  Intake 240 ml  Output 125 ml  Net 115 ml   Last 3 Weights 11/20/2018 11/20/2018 06/23/2018  Weight (lbs) 170 lb 170 lb 169 lb 9.6 oz  Weight (kg) 77.111 kg 77.111 kg 76.93 kg      Telemetry    NSR 80s - Personally Reviewed  ECG    No new EKG to review - Personally Reviewed  Physical Exam   GEN: No acute distress.   Neck: No JVD Cardiac: RRR, no murmurs, rubs, or gallops.  Respiratory: Clear to auscultation bilaterally. GI: Soft, nontender, non-distended  MS: No edema; No deformity. Neuro:  Nonfocal  Psych: Normal affect   Labs     High Sensitivity Troponin:  No results for input(s): TROPONINIHS in the last 720 hours.    Cardiac EnzymesNo results for input(s): TROPONINI in the last 168 hours. No results for input(s): TROPIPOC in the last 168 hours.   Chemistry Recent Labs  Lab 11/20/18 2243 11/21/18 0349 11/24/18 0338 11/24/18 1031 11/25/18 0445  NA 139 138 137 139 138  K 4.0 4.1 4.0 4.2 4.0  CL 105 106 106  --  110  CO2 24 23 22   --  20*  GLUCOSE 94 100* 79 104* 134*  BUN 21 19 11   --  12  CREATININE 1.06 1.01 1.09  --  0.93  CALCIUM 9.0 8.7* 8.5*  --  8.0*  PROT 7.5  --   --   --   --   ALBUMIN 3.7  --   --   --   --   AST 31  --   --   --   --   ALT 20  --   --   --   --   ALKPHOS 110  --   --   --   --   BILITOT 0.3  --   --   --   --   GFRNONAA >60 >60 >60  --  >60  GFRAA >60 >60 >60  --  >  60  ANIONGAP 10 9 9   --  8     Hematology Recent Labs  Lab 11/23/18 0720 11/24/18 0338 11/24/18 1031 11/25/18 0445  WBC 6.1 6.2  --  7.8  RBC 4.14* 4.04*  --  3.21*  HGB 11.8* 11.8* 10.5* 9.4*  HCT 38.0* 36.7* 31.0* 29.0*  MCV 91.8 90.8  --  90.3  MCH 28.5 29.2  --  29.3  MCHC 31.1 32.2  --  32.4  RDW 13.7 13.5  --  13.7  PLT 170 166  --  122*    BNPNo results for input(s): BNP, PROBNP in the last 168 hours.   DDimer No results for input(s): DDIMER in the last 168 hours.   Radiology    Dg Chest Port 1 View  Result Date: 11/24/2018 CLINICAL DATA:  Postop check. EXAM: PORTABLE CHEST 1 VIEW COMPARISON:  None. FINDINGS: The heart size and mediastinal contours are within normal limits. No pneumothorax or pleural effusion is noted. Minimal bibasilar subsegmental atelectasis is noted. Status post coronary bypass graft. The visualized skeletal structures are unremarkable. IMPRESSION: Minimal bibasilar subsegmental atelectasis. Aortic Atherosclerosis (ICD10-I70.0). Electronically Signed   By: Marijo Conception M.D.   On: 11/24/2018 14:59    Cardiac Studies   Nuclear stress test 11/20/18  Study Result     Defect 1: There is a large defect of severe severity present in the basal anteroseptal, basal inferoseptal, basal inferior, basal inferolateral, mid inferoseptal, mid inferior, mid inferolateral, apical inferior and apex location.  Findings consistent with prior myocardial infarction with peri-infarct ischemia.  The left ventricular ejection fraction is moderately decreased (30-44%).  Change compared to prior study.  There is a large, severe defect across the entire septum and inferior wall to apex. On stress imaging, there is a partially reversible section inferolaterally and towards the apex. Overall it is a small proportion of the entire defect that is reversible. This is consistent with prior infarct with mild peri-infarct ischemia.   2D Echo 11/21/18 IMPRESSIONS   1. The left ventricle is mildly dilated. The left ventricle has mild-moderately reduced systolic function, with an ejection fraction of 40-45%. Left ventricular diastolic Doppler parameters are consistent with indeterminate diastolic dysfunction. Basal  to mid inferior akinesis, basal inferoseptal hypokinesis. 2. There is mild mitral annular calcification present. No evidence of mitral valve stenosis. Mild mitral regurgitation. 3. The aortic valve is tricuspid Aortic valve regurgitation is trivial by color flow Doppler. No stenosis of the aortic valve. 4. The right ventricle has mildly reduced systolic function. The cavity was mildly enlarged. There is no increase in right ventricular wall thickness. 5. Left atrial size was mildly dilated. 6. Right atrial size was mildly dilated. 7. Limited echo.   Patient Profile     78 y.o.malewith hx of CAD with CABG 2001 (LIMA to the LAD; SVG to RI; SVG to left Cx and SVG to PDA), Dizziness, Hyperlipidemia, Hypertension,? PossiblePFO diagnosed in 2018(negative bubble study 10/2018), PVD with AAA repair and LLE fem bypass in 2001 and then s/p right CFA endarterectomy  and profundoplasty by Dr. Oneida Alar in 2018. Admitted with claudication in his LLE and dusky toes and required LE bypass surgery. Cardiology was consulted for preop cardiac clearance and for atrial fibrillation.    Assessment & Plan    1. PVD/ Critical Limb Ischemia: POD#2 s/p Thrombectomy left limb aortobifemoral bypass, left common femoral endarterectomy with profundoplasty, patch angioplasty left common femoral superficial femoral profunda femoris artery (Dacron). Seen by vascular surgery today, per rounding note,  he is perfusion LLE well w/ brisk PT signal by doppler. Plan is for likely d/c today.   2. Cardiac: doing well post op. No CP or dyspnea. VSS. Euvolemic on exam. NSR. HR in the 70s. Continue current cardiac medications. No changes.   3. Atrial Fibrillation: New diagnosis. He required IV amiodarone and has been transitioned to PO. He converted and has been maintaining NSR. HR controlled in the 70s. Continue Amiodarone loading. Currently on 400 mg BID. Will need gradual titration down to matainence dose of 200 mg daily over the next several weeks. CHA2DS2 VASc score is 5. Ok to start anticoagulation per vascular surgery. They have placed a case manager consult for price comparison of Xarelto vs Eliquis.   4. Anemia: Hgb drom from 10.5>>9.4, post op. He will be starting a/c for afib, Xarelto vs Eliquis depending on insurance coverage. We will get f/u CBC post hospital to ensure stable H/H post initiation.   5. CAD: s/p remote CABG. Stable w/o CP. Continue medical therapy. ASA, statin and ? blocker  6. Systolic HF: preoperative echocardiogram 11/21/18 showed mild-moderately reduced LVEF at 40-45%. ? If tachy mediated due to recent rapid atrial fibrillation. He does have known CAD but no recent anginal symptoms. He appears euvolemic on exam and he has no dyspnea. This can be followed by Dr. Radford Pax in clinic. Medical management may be limited by soft BP. No room to add an ACE/ARB at this time.  He is currently on short acting metoprolol. BP is soft but will monitor on 12.5 mg dose. If he continues to tolerate, would recommend eventual change from metoprolol tartrate to metoprolol succinate, approved for the management of chronic systolic HF. We can make med adjustment at outpatient f/u.   Dispo: f/u with Dr. Radford Pax or APP in 1-2 weeks. I will arrange f/u and will place in AVS.    For questions or updates, please contact [place] Please consult [email or web address] for contact info under      Signed, Lyda Jester, PA-C  11/26/2018, 10:07 AM      Patient seen and examined. Agree with assessment and plan. Cardiac stable; currently undergoing ABI's. For dc today; cardiology F/U with Dr. Radford Pax.   Troy Sine, MD, [facility] 11/26/2018 10:25 AM

## 2018-11-26 NOTE — TOC Benefit Eligibility Note (Signed)
Transition of Care [facility]) Benefit Eligibility Note    Patient Details  Name: CECILIA NISHIKAWA MRN: [id number] Date of Birth: November 21, 1940   Medication/Dose: Alveda Reasons  15 MG BID  AND XARELTO 20 MG  Covered?: Yes  Tier: 3 Drug  Prescription Coverage Preferred Pharmacy: [facility]  Spoke with Person/Company/Phone Number:: KAREY  @  [employer] # [phone]  Co-Pay: $ 47.00  Prior Approval: No  Deductible: Unmet  Additional Notes: ELIQUIS  2.5 MG BID  AND  ELIQUIS 5 MG BID, COVER- YES, CO-PAY- $ 47.00, TIER- 3 DRUG,P/A-NO    Memory Argue Phone Number: 11/26/2018, 11:00 AM

## 2018-11-26 NOTE — Progress Notes (Addendum)
  Progress Note    11/26/2018 7:33 AM 2 Days Post-Op  Subjective:  Wants to go home today   Vitals:   11/25/18 2003 11/26/18 0420  BP: 112/71 112/73  Pulse:    Resp: 11 12  Temp: 98.3 F (36.8 C) 98.7 F (37.1 C)  SpO2: 95% 94%   Physical Exam: Lungs:  Non labored breathing Incisions:  L groin c/d/i Extremities:  L PT brisk by doppler Neurologic: A&O  CBC    Component Value Date/Time   WBC 7.8 11/25/2018 0445   RBC 3.21 (L) 11/25/2018 0445   HGB 9.4 (L) 11/25/2018 0445   HCT 29.0 (L) 11/25/2018 0445   PLT 122 (L) 11/25/2018 0445   MCV 90.3 11/25/2018 0445   MCH 29.3 11/25/2018 0445   MCHC 32.4 11/25/2018 0445   RDW 13.7 11/25/2018 0445    BMET    Component Value Date/Time   NA 138 11/25/2018 0445   K 4.0 11/25/2018 0445   CL 110 11/25/2018 0445   CO2 20 (L) 11/25/2018 0445   GLUCOSE 134 (H) 11/25/2018 0445   BUN 12 11/25/2018 0445   CREATININE 0.93 11/25/2018 0445   CALCIUM 8.0 (L) 11/25/2018 0445   GFRNONAA >60 11/25/2018 0445   GFRAA >60 11/25/2018 0445    INR    Component Value Date/Time   INR 1.2 11/24/2018 0338     Intake/Output Summary (Last 24 hours) at 11/26/2018 0733 Last data filed at 11/25/2018 1844 Gross per 24 hour  Intake 240 ml  Output 225 ml  Net 15 ml     Assessment/Plan:  78 y.o. male is s/p L CFA endart and profundoplasty 2 Days Post-Op   Perfusing LLE well with brisk PT signal by doppler Case manager consulted; will either start Xarelto or Eliquis today Home likely this afternoon   Dagoberto Ligas, PA-C [facility] [phone] 11/26/2018 7:33 AM   Agree with above.  Will start oral anticoagulation today. Needs post op ABIs prior to d/c  Ruta Hinds, MD [facility] [place] Office: [phone] Pager: [phone]

## 2018-11-27 NOTE — Discharge Summary (Signed)
Physician Discharge Summary   Patient ID: Frank Arias [id number] 78 y.o. 09-05-1940  Admit date: 11/20/2018  Discharge date and time: 11/26/2018  4:27 PM   Admitting Physician: Elam Dutch, MD  Discharge Physician: same  Admission Diagnoses: critical limb ischemia  Discharge Diagnoses: PAD  Admission Condition: poor  Discharged Condition: fair  Indication for Admission: ischemic LLE  Hospital Course: [name] is a 78 year old male who came through our office and was admitted due critical limb ischemia of left lower extremity.  He was started on IV heparin over the weekend.  He was brought to the operating room on Monday and underwent thrombectomy of left limb of aortobifemoral bypass and left common femoral endarterectomy with profundoplasty by Dr. Oneida Alar on 11/24/2018.  He tolerated the procedure well and was admitted postoperatively.  It should also be noted that over the course of the weekend he had an abnormal heart rhythm and was diagnosed by cardiology consult with new atrial fibrillation and started on amiodarone as well as beta blockade.  Postoperatively he again was found to be in and out of atrial fibrillation and anticoagulation was recommended by cardiology.  He will follow-up with Dr. Radford Pax in about 2 weeks for titration of amiodarone and beta-blockade.  During hospitalization he was started on Xarelto which she will continue indefinitely.  He will follow-up in office with Dr. fields in about 2 weeks.  It should be noted at the time of discharge patient had a brisk left posterior tibial Doppler signal.  Discharge instructions were reviewed with the patient and he voiced his understanding.  He was discharged to home in stable condition.  Consults: cardiology  Treatments: surgery: Thrombectomy left limb aortobifemoral bypass, left common femoral endarterectomy with profundoplasty by Dr. Oneida Alar on 11/24/2018  Discharge Exam: See progress note 11/26/2018 Vitals:   11/26/18 0819 11/26/18 0947  BP: 114/69   Pulse: 71 73  Resp: 19   Temp: 98.4 F (36.9 C)   SpO2: 95%      Disposition: Home  Patient Instructions:  Allergies as of 11/26/2018      Reactions   Morphine And Related Other (See Comments)   CONFUSED      Medication List    TAKE these medications   amiodarone 400 MG tablet Commonly known as: PACERONE Take 1 tablet (400 mg total) by mouth 2 (two) times daily. Notes to patient: Take tonight   aspirin EC 81 MG tablet Take 1 tablet (81 mg total) by mouth daily. Notes to patient: Take tomorrow AM 11/27/2018   atorvastatin 10 MG tablet Commonly known as: LIPITOR Take 10 mg by mouth daily. Notes to patient: Take tomorrow AM 11/27/2018   metoprolol tartrate 25 MG tablet Commonly known as: LOPRESSOR Take 0.5 tablets (12.5 mg total) by mouth 2 (two) times daily. Notes to patient: Take tonight   oxyCODONE-acetaminophen 5-325 MG tablet Commonly known as: PERCOCET/ROXICET Take 1 tablet by mouth every 6 (six) hours as needed for moderate pain.   Rivaroxaban 15 & 20 MG Tbpk Take as directed on package: Start with one 15mg  tablet by mouth twice a day with food. On Day 22, switch to one 20mg  tablet once a day with food.      Activity: activity as tolerated Diet: regular diet Wound Care: keep wound clean and dry  Follow-up with Dr. Oneida Alar in 2 weeks.  SignedDagoberto Ligas 11/27/2018 9:23 AM

## 2018-12-02 DIAGNOSIS — R5381 Other malaise: Secondary | ICD-10-CM | POA: Diagnosis not present

## 2018-12-02 DIAGNOSIS — G4700 Insomnia, unspecified: Secondary | ICD-10-CM | POA: Diagnosis not present

## 2018-12-02 DIAGNOSIS — I4891 Unspecified atrial fibrillation: Secondary | ICD-10-CM | POA: Diagnosis not present

## 2018-12-02 DIAGNOSIS — I2510 Atherosclerotic heart disease of native coronary artery without angina pectoris: Secondary | ICD-10-CM | POA: Diagnosis not present

## 2018-12-02 DIAGNOSIS — I998 Other disorder of circulatory system: Secondary | ICD-10-CM | POA: Diagnosis not present

## 2018-12-02 DIAGNOSIS — L989 Disorder of the skin and subcutaneous tissue, unspecified: Secondary | ICD-10-CM | POA: Diagnosis not present

## 2018-12-09 DIAGNOSIS — C44319 Basal cell carcinoma of skin of other parts of face: Secondary | ICD-10-CM | POA: Diagnosis not present

## 2018-12-11 ENCOUNTER — Encounter: Payer: Medicare HMO | Admitting: Vascular Surgery

## 2018-12-12 NOTE — Progress Notes (Signed)
  POST OPERATIVE OFFICE NOTE    CC:  F/u for surgery  HPI:  This is a 78 y.o. male who is s/p Thrombectomy left limb aortobifemoral bypass, left common femoral endarterectomy with profundoplasty, patch angioplasty left common femoral superficial femoral profunda femoris artery (Dacron) on 11/24/2018 by Dr. Oneida Alar.   On POD 2, he was started on [facility].  He had a brisk left PT doppler signal and was discharged home.  He presents today for follow up.   He states he has done well since being home.  He is able to mow and weed eat without any problems.    Allergies  Allergen Reactions  . Morphine And Related Other (See Comments)    CONFUSED    Current Outpatient Medications  Medication Sig Dispense Refill  . amiodarone (PACERONE) 400 MG tablet Take 1 tablet (400 mg total) by mouth 2 (two) times daily. 60 tablet 0  . aspirin EC 81 MG tablet Take 1 tablet (81 mg total) by mouth daily. 90 tablet 3  . atorvastatin (LIPITOR) 10 MG tablet Take 10 mg by mouth daily.    . metoprolol tartrate (LOPRESSOR) 25 MG tablet Take 0.5 tablets (12.5 mg total) by mouth 2 (two) times daily. 60 tablet 0  . oxyCODONE-acetaminophen (PERCOCET/ROXICET) 5-325 MG tablet Take 1 tablet by mouth every 6 (six) hours as needed for moderate pain. 20 tablet 0  . Rivaroxaban 15 & 20 MG TBPK Take as directed on package: Start with one 15mg  tablet by mouth twice a day with food. On Day 22, switch to one 20mg  tablet once a day with food. 51 each 0   No current facility-administered medications for this visit.      ROS:  See HPI  Physical Exam:  Today's Vitals   12/15/18 1255  BP: (!) 147/72  Pulse: 64  Resp: 14  Temp: 97.7 F (36.5 C)  TempSrc: Temporal  SpO2: 98%  Weight: 160 lb (72.6 kg)  Height: 5\' 7"  (1.702 m)   Body mass index is 25.06 kg/m.   Incision:  Left groin incision is clean and dry and healing nicely Extremities:  Brisk left PT doppler signal present   Assessment/Plan:  This is a 78 y.o. male who  is s/p: Thrombectomy left limb aortobifemoral bypass, left common femoral endarterectomy with profundoplasty, patch angioplasty left common femoral superficial femoral profunda femoris artery (Dacron) on 11/24/2018 by Dr. Oneida Alar.   -pt doing well with brisk left PT doppler signal -he will f/u in 3 months with ABI's and see Dr. Oneida Alar -he will call sooner should he have any issues.  -he continues to take his [place], PA-C [facility] [phone]  Clinic MD:  Trula Slade

## 2018-12-15 ENCOUNTER — Ambulatory Visit (INDEPENDENT_AMBULATORY_CARE_PROVIDER_SITE_OTHER): Payer: Self-pay | Admitting: Physician Assistant

## 2018-12-15 ENCOUNTER — Other Ambulatory Visit: Payer: Self-pay

## 2018-12-15 VITALS — BP 147/72 | HR 64 | Temp 97.7°F | Resp 14 | Ht 67.0 in | Wt 160.0 lb

## 2018-12-15 DIAGNOSIS — I739 Peripheral vascular disease, unspecified: Secondary | ICD-10-CM

## 2018-12-16 ENCOUNTER — Telehealth: Payer: Self-pay | Admitting: Cardiology

## 2018-12-16 NOTE — Telephone Encounter (Signed)
No interactions with Belsomra - would clarify dosing with pt so we can add medication to his med list.

## 2018-12-16 NOTE — Telephone Encounter (Signed)
Pt aware of recommendation. He will call the office to update his medication list when he is prescribed the Belsomra.

## 2018-12-16 NOTE — Telephone Encounter (Signed)
New message   Pt c/o medication issue:  1. Name of Medication: eelsomra suborexant   2. How are you currently taking this medication (dosage and times per day)? n/a  3. Are you having a reaction (difficulty breathing--STAT)? n/a  4. What is your medication issue? Patient wants to know if this medication is okay to take with the other medications that he is taking. Please advise.

## 2018-12-16 NOTE — Telephone Encounter (Signed)
Called and clarified pt's medication, Belsomra. He would like to know if if interacts with any of his current medications. Per [employer] database, this medication does not interact with any of pt's current medications including Amiodarone and Xarelto.   I will forward to pharmacy to be sure information is correct.   Pt has verbalized understanding and had no additional questions.

## 2018-12-21 ENCOUNTER — Encounter: Payer: Self-pay | Admitting: Cardiology

## 2018-12-21 DIAGNOSIS — I420 Dilated cardiomyopathy: Secondary | ICD-10-CM

## 2018-12-21 HISTORY — DX: Dilated cardiomyopathy: I42.0

## 2018-12-21 NOTE — Progress Notes (Signed)
Cardiology Office Note:    Date:  12/23/2018   ID:  RAFAT POAGUE, DOB 01-29-1941, MRN [id number]  PCP:  Shirline Frees, MD  Cardiologist:  Fransico Him, MD    Referring MD: Shirline Frees, MD   Chief Complaint  Patient presents with  . Coronary Artery Disease  . Hypertension  . Hyperlipidemia    History of Present Illness:    JAMEL WINKER is a 78 y.o. male with a hx of CAD s/p CABG 2001 (LIMA to the LAD; SVG to RI; SVG to left Cx and SVG to PDA), Dizziness, Hyperlipidemia, Hypertension, ? Possible PFO diagnosed in 2018 (negative bubble study 10/2018), PVD s/p right CFA endarterectomy and profundoplasty by Dr. Oneida Alar in 2018. 2D echo and bubble study was ordered which showed LVEF 45-50% LV moderately dilated, mild concentric with LVH, impaired relaxation, normal LV filling pressures, diffuse hypokinesis, left atrial size was mildly dilated, moderate MR. There was no evidence of PFO. 30 day event monitor showed sinus bradycardia, NSR, and sinus tachy, average HR 72bpm, range from 51-149 bpm; wide complex tachycardia up to 9 beats at 136bpm; occasional PVCs. It also appears a Lexiscan was recommended but patient's daughter elected to defer.  He was recently hospitalized last month with left calf pain after crawling under a house to do some plumbing. Prior to this his left calf was cramping after exertion. Reports using Xanax for the soreness in his left calf. Last ABIs were 08/08/2017 with Dr. Oneida Alar were normal on the left and reasonable on the right. In the office ABIs were rechecked. Right ABI indicated moderate right lower extremity. Left ABI signals were undetectable. Patient was reported to be tachycardic with rate of 140, not indicated if regular or irregular. EKG was not available at that time. Patient was admitted. CTA abd/pelvis with bilateral LE was ordered. Upon arrival to [facility] hospital heart rate was in the 80s and EKG shows NSR with 1 PAC. During the hospitalization he have frequent  runs of PAF with RVR and was loaded on PO Amio and Lopressor.  He subsequently underwent  Thrombectomy of the left imb with aortobifemoral bypass with left common femoral endarterectomy with profundoplasty on 11/24/2018.  Postop he was started on Xarelto 20mg  daily.   He is here today for followup and is doing well.  He denies any chest pain or pressure, SOB, DOE, PND, orthopnea, LE edema, dizziness, palpitations or syncope. He is compliant with his meds and is tolerating meds with no SE.  He tells me that he is still taking Amio 400mg  BID instead of tapering to 200mg  daily.  He also does not think he is taking metoprolol although his pharmacy says he picked it up.    Past Medical History:  Diagnosis Date  . Basal cell carcinoma   . Benign localized prostatic hyperplasia with lower urinary tract symptoms (LUTS)   . CAD (coronary artery disease)    S/P CABG in 2001 with LIMA to the LAD, SVG to RI, SVG to left circumflex and SVG to PDA.  Marland Kitchen DCM (dilated cardiomyopathy) ([place]) 12/21/2018   Ischemic with EF 40-45% by Echo 11/2018  . Dizzy spells   . Erectile dysfunction   . Grief reaction   . Hypercholesterolemia   . Hypertension   . Myocardial infarction ([place])   . Peripheral vascular disease (HCC)    S/P right CFA endarterectomy and profundoplasty by Dr. Oneida Alar in 2018 with a prior history of aortobifem with left fem-BK popliteal graft.  Marland Kitchen PFO (patent  foramen ovale)    on 12/06/16 echo [facility])  . Syncope and collapse     Past Surgical History:  Procedure Laterality Date  . ABDOMINAL AORTIC ANEURYSM REPAIR    . CORONARY ARTERY BYPASS GRAFT  2001    Median sternotomy for coronary artery bypass grafting x 4 (left internal mammary artery to distal left anterior descending coronary artery, saphenous vein graft to ramus intermediate branch, sequential saphenous vein graft to second circumflex marginal branch, saphenous vein graft to posterior descending coronary artery).  . DUPUYTREN  CONTRACTURE RELEASE Left 2012  . ENDARTERECTOMY FEMORAL Right 12/31/2016   Procedure: RIGHT FEMORAL ENDARTERECTOMY;  Surgeon: Elam Dutch, MD;  Location: [place];  Service: Vascular;  Laterality: Right;  . ENDARTERECTOMY FEMORAL Left 11/24/2018   Procedure: ENDARTERECTOMY FEMORAL WITH PROFUNDOPLASTY;  Surgeon: Elam Dutch, MD;  Location: [place];  Service: Vascular;  Laterality: Left;  . KNEE ARTHROSCOPY Right 2001  . LOWER EXTREMITY ANGIOGRAPHY N/A 12/07/2016   Procedure: Lower Extremity Angiography;  Surgeon: Adrian Prows, MD;  Location: [place] CV LAB;  Service: Cardiovascular;  Laterality: N/A;  . MULTIPLE TOOTH EXTRACTIONS    . NASAL SEPTUM SURGERY    . PATCH ANGIOPLASTY Right 12/31/2016   Procedure: PATCH PROFUNDOPLASTY, Right Femoral Profunda using Hemashield vascular patch.;  Surgeon: Elam Dutch, MD;  Location: [facility] OR;  Service: Vascular;  Laterality: Right;  . PATCH ANGIOPLASTY Left 11/24/2018   Procedure: Patch Angioplasty Left Common Femoral Artery;  Surgeon: Elam Dutch, MD;  Location: [place];  Service: Vascular;  Laterality: Left;  . THROMBECTOMY    . THROMBECTOMY ILIAC ARTERY Left 11/24/2018   Procedure: THROMBECTOMY LEFT LIMB OF AORTOBIFEMORAL GRAFT;  Surgeon: Elam Dutch, MD;  Location: [place];  Service: Vascular;  Laterality: Left;  . TONSILLECTOMY      Current Medications: Current Meds  Medication Sig  . amiodarone (PACERONE) 400 MG tablet Take 1 tablet (400 mg total) by mouth 2 (two) times daily.  Marland Kitchen atorvastatin (LIPITOR) 10 MG tablet Take 10 mg by mouth daily.  . Rivaroxaban 15 & 20 MG TBPK Take as directed on package: Start with one 15mg  tablet by mouth twice a day with food. On Day 22, switch to one 20mg  tablet once a day with food.     Allergies:   Morphine and related   Social History   Socioeconomic History  . Marital status: Widowed    Spouse name: Not on file  . Number of children: Not on file  . Years of education: Not on file  .  Highest education level: Not on file  Occupational History  . Not on file  Social Needs  . Financial resource strain: Not on file  . Food insecurity    Worry: Not on file    Inability: Not on file  . Transportation needs    Medical: Not on file    Non-medical: Not on file  Tobacco Use  . Smoking status: Former Smoker    Types: Cigarettes    Quit date: 04/16/1998    Years since quitting: 20.7  . Smokeless tobacco: Never Used  Substance and Sexual Activity  . Alcohol use: Yes    Comment: occasional  . Drug use: No  . Sexual activity: Not on file  Lifestyle  . Physical activity    Days per week: Not on file    Minutes per session: Not on file  . Stress: Not on file  Relationships  . Social connections  Talks on phone: Not on file    Gets together: Not on file    Attends religious service: Not on file    Active member of club or organization: Not on file    Attends meetings of clubs or organizations: Not on file    Relationship status: Not on file  Other Topics Concern  . Not on file  Social History Narrative  . Not on file     Family History: The patient's family history includes Coronary artery disease in an other family member; Lung cancer in an other family member.  ROS:   Please see the history of present illness.    ROS  All other systems reviewed and negative.   EKGs/Labs/Other Studies Reviewed:    The following studies were reviewed today: none  EKG:  EKG is  ordered today.  The ekg ordered today demonstrates NSR at 63bpm with IRBBB and QTc 459ms.   Recent Labs: 11/20/2018: ALT 20 11/23/2018: TSH 5.101 11/25/2018: BUN 12; Creatinine, Ser 0.93; Hemoglobin 9.4; Platelets 122; Potassium 4.0; Sodium 138   Recent Lipid Panel    Component Value Date/Time   CHOL 105 11/24/2018 0338   TRIG 44 11/24/2018 0338   HDL 47 11/24/2018 0338   CHOLHDL 2.2 11/24/2018 0338   VLDL 9 11/24/2018 0338   LDLCALC 49 11/24/2018 0338    Physical Exam:    VS:  BP 116/70    Pulse (!) 59   Ht 5\' 7"  (1.702 m)   Wt 173 lb 3.2 oz (78.6 kg)   SpO2 99%   BMI 27.13 kg/m     Wt Readings from Last 3 Encounters:  12/23/18 173 lb 3.2 oz (78.6 kg)  12/15/18 160 lb (72.6 kg)  11/20/18 170 lb (77.1 kg)     GEN:  Well nourished, well developed in no acute distress HEENT: Normal NECK: No JVD; No carotid bruits LYMPHATICS: No lymphadenopathy CARDIAC: RRR, no murmurs, rubs, gallops RESPIRATORY:  Clear to auscultation without rales, wheezing or rhonchi  ABDOMEN: Soft, non-tender, non-distended MUSCULOSKELETAL:  No edema; No deformity  SKIN: Warm and dry NEUROLOGIC:  Alert and oriented x 3 PSYCHIATRIC:  Normal affect   ASSESSMENT:    1. PAF (paroxysmal atrial fibrillation) ([place])   2. Coronary artery disease due to lipid rich plaque   3. Hyperlipidemia LDL goal <70   4. PAD (peripheral artery disease) ([place])   5. Benign essential HTN   6. DCM (dilated cardiomyopathy) (HCC)    PLAN:    In order of problems listed above:  1.  Paroxysmal atrial fibrillation - maintaining NSR.  No bleeding on DOAC.  Continue Xarelto.  Creatinine 0.93 and Hbg 9.4. He claims that he is not taking lopressor.  I will not restart it since he is borderline bradycardic.  He was supposed to taper down on Amio over a few weeks and did not so I instructed him to decrease Amio to 200mg  daily.  Check TSH, LFTs in 02/2019 and followup with PA in 3 months.   2.  ASCAD - s/p CABG 2001 (LIMA to the LAD; SVG to RI; SVG to left Cx and SVG to PDA).  He has not had any anginal sx.  Continue on statin and BB.  No ASA due to DOAC.   3.  Hyperlipidemia - LDL goal < 70.  Continue atorvastatin 10mg  daily. LDL at goal at 49.  4.  PVD - s/p thrombectomy of the left imb with aortobifemoral bypass with left common femoral endarterectomy with profundoplasty  on 11/24/2018. followed by Oneida Alar.    5.  HTN - BP controlled on no meds.  6.  Ischemic DCM - EF 40-45% on echo 11/2018.  Apparently not taking BB.  He is  borderline bradycardic on the Amio so will not restart BB.  Add losartan 12.5mg  daily.  BMET in 1 week.   Medication Adjustments/Labs and Tests Ordered: Current medicines are reviewed at length with the patient today.  Concerns regarding medicines are outlined above.  No orders of the defined types were placed in this encounter.  No orders of the defined types were placed in this encounter.   Signed, Fransico Him, MD  12/23/2018 8:24 AM    [place]

## 2018-12-23 ENCOUNTER — Other Ambulatory Visit: Payer: Self-pay | Admitting: *Deleted

## 2018-12-23 ENCOUNTER — Ambulatory Visit (INDEPENDENT_AMBULATORY_CARE_PROVIDER_SITE_OTHER): Payer: Medicare HMO | Admitting: Cardiology

## 2018-12-23 ENCOUNTER — Telehealth: Payer: Self-pay | Admitting: Cardiology

## 2018-12-23 ENCOUNTER — Encounter: Payer: Self-pay | Admitting: Cardiology

## 2018-12-23 ENCOUNTER — Other Ambulatory Visit: Payer: Self-pay

## 2018-12-23 VITALS — BP 116/70 | HR 59 | Ht 67.0 in | Wt 173.2 lb

## 2018-12-23 DIAGNOSIS — I420 Dilated cardiomyopathy: Secondary | ICD-10-CM | POA: Diagnosis not present

## 2018-12-23 DIAGNOSIS — I480 Paroxysmal atrial fibrillation: Secondary | ICD-10-CM

## 2018-12-23 DIAGNOSIS — I10 Essential (primary) hypertension: Secondary | ICD-10-CM | POA: Diagnosis not present

## 2018-12-23 DIAGNOSIS — I739 Peripheral vascular disease, unspecified: Secondary | ICD-10-CM

## 2018-12-23 DIAGNOSIS — E785 Hyperlipidemia, unspecified: Secondary | ICD-10-CM | POA: Diagnosis not present

## 2018-12-23 DIAGNOSIS — I2583 Coronary atherosclerosis due to lipid rich plaque: Secondary | ICD-10-CM

## 2018-12-23 DIAGNOSIS — I2510 Atherosclerotic heart disease of native coronary artery without angina pectoris: Secondary | ICD-10-CM | POA: Diagnosis not present

## 2018-12-23 MED ORDER — RIVAROXABAN 20 MG PO TABS: 20.0000 mg | ORAL_TABLET | Freq: Every day | ORAL | 6 refills | Status: DC

## 2018-12-23 MED ORDER — AMIODARONE HCL 200 MG PO TABS: 200.0000 mg | ORAL_TABLET | Freq: Every day | ORAL | 3 refills | Status: DC

## 2018-12-23 MED ORDER — LOSARTAN POTASSIUM 25 MG PO TABS: 12.5000 mg | ORAL_TABLET | Freq: Every day | ORAL | 3 refills | Status: DC

## 2018-12-23 NOTE — Telephone Encounter (Signed)
LM for pt to call back for verbal okay to call his daughter re: her message.. did not find a DPR on file.

## 2018-12-23 NOTE — Telephone Encounter (Signed)
Patient's daughter has questions about the medications her father is on. Would like to speak to nurse.

## 2018-12-23 NOTE — Patient Instructions (Signed)
Medication Instructions:  1) DECREASE Amiodarone to 200mg  once daily 2) START Losartan 12.5mg  once daily  If you need a refill on your cardiac medications before your next appointment, please call your pharmacy.   Lab work: Your physician recommends that you return for lab work in: 1 week (BMET)  Your physician recommends that you return for lab work in: November (TSH, LFTs)   If you have labs (blood work) drawn today and your tests are completely normal, you will receive your results only by: Marland Kitchen MyChart Message (if you have MyChart) OR . A paper copy in the mail If you have any lab test that is abnormal or we need to change your treatment, we will call you to review the results.  Testing/Procedures: None  Follow-Up: Your physician recommends that you schedule a follow-up appointment in: 3 months with a PA or NP on our team.   At [facility], you and your health needs are our priority.  As part of our continuing mission to provide you with exceptional heart care, we have created designated Provider Care Teams.  These Care Teams include your primary Cardiologist (physician) and Advanced Practice Providers (APPs -  Physician Assistants and Nurse Practitioners) who all work together to provide you with the care you need, when you need it. You will need a follow up appointment in 6 months.  Please call our office 2 months in advance to schedule this appointment.  You may see Fransico Him, MD or one of the following Advanced Practice Providers on your designated Care Team:   [place], PA-C Melina Copa, PA-C . Ermalinda Barrios, PA-C  Any Other Special Instructions Will Be Listed Below (If Applicable).

## 2018-12-23 NOTE — Telephone Encounter (Signed)
Spoke with the pts daughter per pt verbal consent ... pt is to stay on Xarelto and she will call Dr. Oneida Alar or his refill.

## 2018-12-24 ENCOUNTER — Telehealth: Payer: Self-pay | Admitting: Cardiology

## 2018-12-24 DIAGNOSIS — I480 Paroxysmal atrial fibrillation: Secondary | ICD-10-CM

## 2018-12-24 NOTE — Telephone Encounter (Signed)
The patient is calling because his Amiodarone is making him "shaky."  It was reduced to 200 mg daily on 9/8, originally he was taking 400 mg bid.  He is willing to try this change until Monday and update [place].

## 2018-12-24 NOTE — Telephone Encounter (Signed)
New message:    Patient calling concering his medications it has him shaking. Please call patient.

## 2018-12-24 NOTE — Telephone Encounter (Signed)
Refer to afib clinic 

## 2018-12-25 NOTE — Telephone Encounter (Signed)
LMTCB

## 2018-12-25 NOTE — Telephone Encounter (Signed)
Follow up ° ° °Patient is returning your call. Please call. ° ° ° °

## 2018-12-25 NOTE — Telephone Encounter (Signed)
Pt agrees to consult with the Afib clinic.Marland Kitchen

## 2018-12-25 NOTE — Telephone Encounter (Signed)
Spoke with Marzetta Board in the Afib clinic and she r/s the pt for 01/02/19 11am per the pts daughter request.... it is not appropriate for them to make any med changes without seeing the pt but since he was in NSR on his EKG yesterday it would be reasonable for him to hold his Amiodarone due to side effects until seen by them next week. Pts daughter advised and will call us next week when she is back in town for the clinic directions and gate code.

## 2018-12-25 NOTE — Telephone Encounter (Signed)
Spoke with the pts daughter Freda Munro and she is asking if we can move his Afib clinic appt to 01/02/19 so she can bring him since she is his aids in his care.. she is in [place] until 12/31/18.    She is saying the pt is refusing to take any more Amiodarone since he is shaky uncontrollably and it seems to worsen with every dose that he takes.   I have LM for the AFIB clinic to move his appt and I will forward this message for Dr. Christene Lye NP review and recommendations.

## 2018-12-29 ENCOUNTER — Ambulatory Visit (HOSPITAL_COMMUNITY): Payer: Medicare HMO | Admitting: Nurse Practitioner

## 2019-01-02 ENCOUNTER — Encounter (HOSPITAL_COMMUNITY): Payer: Self-pay | Admitting: Nurse Practitioner

## 2019-01-02 ENCOUNTER — Ambulatory Visit (HOSPITAL_COMMUNITY)
Admission: RE | Admit: 2019-01-02 | Discharge: 2019-01-02 | Disposition: A | Discharge status: Home / Self Care | Payer: Medicare HMO | Source: Ambulatory Visit | Attending: Nurse Practitioner | Admitting: Nurse Practitioner

## 2019-01-02 ENCOUNTER — Other Ambulatory Visit: Payer: Medicare HMO

## 2019-01-02 ENCOUNTER — Other Ambulatory Visit: Payer: Self-pay

## 2019-01-02 VITALS — BP 138/70 | HR 63 | Ht 67.0 in | Wt 171.8 lb

## 2019-01-02 DIAGNOSIS — I2510 Atherosclerotic heart disease of native coronary artery without angina pectoris: Secondary | ICD-10-CM | POA: Diagnosis not present

## 2019-01-02 DIAGNOSIS — Z951 Presence of aortocoronary bypass graft: Secondary | ICD-10-CM | POA: Insufficient documentation

## 2019-01-02 DIAGNOSIS — Z87891 Personal history of nicotine dependence: Secondary | ICD-10-CM | POA: Diagnosis not present

## 2019-01-02 DIAGNOSIS — I420 Dilated cardiomyopathy: Secondary | ICD-10-CM | POA: Diagnosis not present

## 2019-01-02 DIAGNOSIS — I255 Ischemic cardiomyopathy: Secondary | ICD-10-CM | POA: Diagnosis not present

## 2019-01-02 DIAGNOSIS — I739 Peripheral vascular disease, unspecified: Secondary | ICD-10-CM | POA: Diagnosis not present

## 2019-01-02 DIAGNOSIS — I252 Old myocardial infarction: Secondary | ICD-10-CM | POA: Diagnosis not present

## 2019-01-02 DIAGNOSIS — Z79899 Other long term (current) drug therapy: Secondary | ICD-10-CM | POA: Diagnosis not present

## 2019-01-02 DIAGNOSIS — Z7901 Long term (current) use of anticoagulants: Secondary | ICD-10-CM | POA: Insufficient documentation

## 2019-01-02 DIAGNOSIS — I10 Essential (primary) hypertension: Secondary | ICD-10-CM | POA: Diagnosis not present

## 2019-01-02 DIAGNOSIS — E7800 Pure hypercholesterolemia, unspecified: Secondary | ICD-10-CM | POA: Insufficient documentation

## 2019-01-02 DIAGNOSIS — I480 Paroxysmal atrial fibrillation: Secondary | ICD-10-CM | POA: Diagnosis not present

## 2019-01-02 NOTE — Progress Notes (Signed)
Primary Care Physician: Shirline Frees, MD Referring Physician: Dr. Shara Blazing is a 78 y.o. male with a h/o CAD, s/p bypass, PVD, recently with hospitalization of thrombectomy left iliac artery, 12/03/18, HTN, paroxysmal afib  that is in the afib clinic, referred by Dr. Radford Pax. She saw him post op, 12/24/18 and pt was doing well and was in [place] after being started on amiodarone with  runs of afib with RVR, prior to surgery. He did not down titrate his amiodarone and was still on 400 mg bid since discharge. The next day  he called in c/o extreme shakiness of his hands to the point he was having trouble eating/drinking. At that point he was told to stop drug. In the office today the shakiness has significantly diminished. He is in SR. He never felt the afib and does not want to go back on amiodarone or any heart drugs. "I feel fine." He does continue on xarelto.  Today, he denies symptoms of palpitations, chest pain, shortness of breath, orthopnea, PND, lower extremity edema, dizziness, presyncope, syncope, or neurologic sequela. The patient is tolerating medications without difficulties and is otherwise without complaint today.   Past Medical History:  Diagnosis Date  . Basal cell carcinoma   . Benign localized prostatic hyperplasia with lower urinary tract symptoms (LUTS)   . CAD (coronary artery disease)    S/P CABG in 2001 with LIMA to the LAD, SVG to RI, SVG to left circumflex and SVG to PDA.  Marland Kitchen DCM (dilated cardiomyopathy) ([place]) 12/21/2018   Ischemic with EF 40-45% by Echo 11/2018  . Dizzy spells   . Erectile dysfunction   . Grief reaction   . Hypercholesterolemia   . Hypertension   . Myocardial infarction ([place])   . Peripheral vascular disease (HCC)    S/P right CFA endarterectomy and profundoplasty by Dr. Oneida Alar in 2018 with a prior history of aortobifem with left fem-BK popliteal graft.  Marland Kitchen PFO (patent foramen ovale)    on 12/06/16 echo [facility])  . Syncope  and collapse    Past Surgical History:  Procedure Laterality Date  . ABDOMINAL AORTIC ANEURYSM REPAIR    . CORONARY ARTERY BYPASS GRAFT  2001    Median sternotomy for coronary artery bypass grafting x 4 (left internal mammary artery to distal left anterior descending coronary artery, saphenous vein graft to ramus intermediate branch, sequential saphenous vein graft to second circumflex marginal branch, saphenous vein graft to posterior descending coronary artery).  . DUPUYTREN CONTRACTURE RELEASE Left 2012  . ENDARTERECTOMY FEMORAL Right 12/31/2016   Procedure: RIGHT FEMORAL ENDARTERECTOMY;  Surgeon: Elam Dutch, MD;  Location: [place];  Service: Vascular;  Laterality: Right;  . ENDARTERECTOMY FEMORAL Left 11/24/2018   Procedure: ENDARTERECTOMY FEMORAL WITH PROFUNDOPLASTY;  Surgeon: Elam Dutch, MD;  Location: [place];  Service: Vascular;  Laterality: Left;  . KNEE ARTHROSCOPY Right 2001  . LOWER EXTREMITY ANGIOGRAPHY N/A 12/07/2016   Procedure: Lower Extremity Angiography;  Surgeon: Adrian Prows, MD;  Location: [place] CV LAB;  Service: Cardiovascular;  Laterality: N/A;  . MULTIPLE TOOTH EXTRACTIONS    . NASAL SEPTUM SURGERY    . PATCH ANGIOPLASTY Right 12/31/2016   Procedure: PATCH PROFUNDOPLASTY, Right Femoral Profunda using Hemashield vascular patch.;  Surgeon: Elam Dutch, MD;  Location: [facility] OR;  Service: Vascular;  Laterality: Right;  . PATCH ANGIOPLASTY Left 11/24/2018   Procedure: Patch Angioplasty Left Common Femoral Artery;  Surgeon: Elam Dutch, MD;  Location: [facility]  OR;  Service: Vascular;  Laterality: Left;  . THROMBECTOMY    . THROMBECTOMY ILIAC ARTERY Left 11/24/2018   Procedure: THROMBECTOMY LEFT LIMB OF AORTOBIFEMORAL GRAFT;  Surgeon: Elam Dutch, MD;  Location: [place];  Service: Vascular;  Laterality: Left;  . TONSILLECTOMY      Current Outpatient Medications  Medication Sig Dispense Refill  . atorvastatin (LIPITOR) 10 MG tablet Take 10 mg by mouth  daily.    . rivaroxaban (XARELTO) 20 MG TABS tablet Take 1 tablet (20 mg total) by mouth daily with supper. 30 tablet 6  . amiodarone (PACERONE) 200 MG tablet Take 1 tablet (200 mg total) by mouth daily. (Patient not taking: Reported on 01/02/2019) 90 tablet 3  . losartan (COZAAR) 25 MG tablet Take 0.5 tablets (12.5 mg total) by mouth daily. (Patient not taking: Reported on 01/02/2019) 45 tablet 3   No current facility-administered medications for this encounter.     Allergies  Allergen Reactions  . Morphine And Related Other (See Comments)    CONFUSED    Social History   Socioeconomic History  . Marital status: Widowed    Spouse name: Not on file  . Number of children: Not on file  . Years of education: Not on file  . Highest education level: Not on file  Occupational History  . Not on file  Social Needs  . Financial resource strain: Not on file  . Food insecurity    Worry: Not on file    Inability: Not on file  . Transportation needs    Medical: Not on file    Non-medical: Not on file  Tobacco Use  . Smoking status: Former Smoker    Types: Cigarettes    Quit date: 04/16/1998    Years since quitting: 20.7  . Smokeless tobacco: Never Used  Substance and Sexual Activity  . Alcohol use: Yes    Comment: occasional  . Drug use: No  . Sexual activity: Not on file  Lifestyle  . Physical activity    Days per week: Not on file    Minutes per session: Not on file  . Stress: Not on file  Relationships  . Social Herbalist on phone: Not on file    Gets together: Not on file    Attends religious service: Not on file    Active member of club or organization: Not on file    Attends meetings of clubs or organizations: Not on file    Relationship status: Not on file  . Intimate partner violence    Fear of current or ex partner: Not on file    Emotionally abused: Not on file    Physically abused: Not on file    Forced sexual activity: Not on file  Other Topics  Concern  . Not on file  Social History Narrative  . Not on file    Family History  Problem Relation Age of Onset  . Coronary artery disease Other   . Lung cancer Other     ROS- All systems are reviewed and negative except as per the HPI above  Physical Exam: Vitals:   01/02/19 1105  BP: 138/70  Pulse: 63  Weight: 77.9 kg  Height: 5\' 7"  (1.702 m)   Wt Readings from Last 3 Encounters:  01/02/19 77.9 kg  12/23/18 78.6 kg  12/15/18 72.6 kg    Labs: Lab Results  Component Value Date   NA 138 11/25/2018   K 4.0 11/25/2018   CL  110 11/25/2018   CO2 20 (L) 11/25/2018   GLUCOSE 134 (H) 11/25/2018   BUN 12 11/25/2018   CREATININE 0.93 11/25/2018   CALCIUM 8.0 (L) 11/25/2018   Lab Results  Component Value Date   INR 1.2 11/24/2018   Lab Results  Component Value Date   CHOL 105 11/24/2018   HDL 47 11/24/2018   LDLCALC 49 11/24/2018   TRIG 44 11/24/2018     GEN- The patient is well appearing, alert and oriented x 3 today.   Head- normocephalic, atraumatic Eyes-  Sclera clear, conjunctiva pink Ears- hearing intact Oropharynx- clear Neck- supple, no JVP Lymph- no cervical lymphadenopathy Lungs- Clear to ausculation bilaterally, normal work of breathing Heart- regular rate and rhythm, no murmurs, rubs or gallops, PMI not laterally displaced GI- soft, NT, ND, + BS Extremities- no clubbing, cyanosis, or edema MS- no significant deformity or atrophy Skin- no rash or lesion Psych- euthymic mood, full affect Neuro- strength and sensation are intact  EKG- EKG shows NSR at 63 bpm, pr int 188 ms, qrs int 108 ms, qtc 499 ms Epic records revied Echo-IMPRESSIONS    1. The left ventricle is mildly dilated. The left ventricle has mild-moderately reduced systolic function, with an ejection fraction of 40-45%. Left ventricular diastolic Doppler parameters are consistent with indeterminate diastolic dysfunction. Basal  to mid inferior akinesis, basal inferoseptal  hypokinesis.  2. There is mild mitral annular calcification present. No evidence of mitral valve stenosis. Mild mitral regurgitation.  3. The aortic valve is tricuspid Aortic valve regurgitation is trivial by color flow Doppler. No stenosis of the aortic valve.  4. The right ventricle has mildly reduced systolic function. The cavity was mildly enlarged. There is no increase in right ventricular wall thickness.  5. Left atrial size was mildly dilated.  6. Right atrial size was mildly dilated.  7. Limited echo.  FINDINGS  Left Ventricle: The left ventricle has mild-moderately reduced systolic function, with an ejection fraction of 40-45%. The cavity size was mildly dilated. There is no increase in left ventricular wall thickness. Left ventricular diastolic Doppler  parameters are consistent with indeterminate diastolic dysfunction. Definity contrast agent was given IV to delineate the left ventricular endocardial borders.  Assessment and Plan: 1. Paroxysmal afib Pt has been on 400 mg amiodarone bid x one month and did not down titrate drug He was told to reduce to 200 mg daily but by that time he had extreme shakiness of hands which has almost resolved with stopping amiodarone for the last week.  He is in SR. He does not want to go back on lower dose amiodarone or a BB at at this point " I feel fine, did not notice  any issues with my heart and I do  not want any new meds." It was explained to pt that this may come back at some point and different meds can be discussed at that time but he is clear he does not want any drugs added today.  2. CHA2DS2VASc score of at least 5 He is on xarelto 20 mg daily  He is aware of increased incidence of stroke with his risk factors and afib  He is willing to continue with taking this drug  F/u with Dr. Radford Pax as scheduled in 6 months and vascular per recall  afib clinic as needed  Butch Penny C. Kamen Hanken, [facility] [address] [place], [place] [address] [phone]

## 2019-01-17 ENCOUNTER — Other Ambulatory Visit: Payer: Self-pay | Admitting: Physician Assistant

## 2019-02-18 ENCOUNTER — Other Ambulatory Visit: Payer: Self-pay

## 2019-02-18 DIAGNOSIS — I739 Peripheral vascular disease, unspecified: Secondary | ICD-10-CM

## 2019-02-23 ENCOUNTER — Other Ambulatory Visit: Payer: Medicare HMO

## 2019-02-26 ENCOUNTER — Encounter: Payer: Self-pay | Admitting: Vascular Surgery

## 2019-02-26 ENCOUNTER — Other Ambulatory Visit: Payer: Self-pay

## 2019-02-26 ENCOUNTER — Ambulatory Visit (HOSPITAL_COMMUNITY)
Admission: RE | Admit: 2019-02-26 | Discharge: 2019-02-26 | Disposition: A | Discharge status: Home / Self Care | Payer: Medicare HMO | Source: Ambulatory Visit | Attending: Vascular Surgery | Admitting: Vascular Surgery

## 2019-02-26 ENCOUNTER — Ambulatory Visit: Payer: Medicare HMO | Admitting: Vascular Surgery

## 2019-02-26 VITALS — BP 137/71 | HR 68 | Temp 97.8°F | Resp 20 | Ht 67.0 in | Wt 173.9 lb

## 2019-02-26 DIAGNOSIS — I739 Peripheral vascular disease, unspecified: Secondary | ICD-10-CM | POA: Diagnosis not present

## 2019-02-26 NOTE — Progress Notes (Signed)
Patient is a 78 year old male who returns for follow-up today.  He previously underwent aortobifemoral bypass and left femoropopliteal by Dr. Kellie Simmering several years ago for abdominal aortic aneurysm.  Most recently he underwent right femoral endarterectomy in 2018.  He then underwent thrombectomy of the left limb of his aortobifemoral bypass and left femoral endarterectomy in August 2020.  He reports that he has mild claudication symptoms at this point.  He does get some cramping in his left leg after walking about 2 blocks.  At the time of operation it was noted that his left femoropopliteal was chronically occluded.  He does not use tobacco.  He is on Xarelto.  This is for atrial fibrillation that was diagnosed at the time of his thrombectomy in August 2020.  Past Medical History:  Diagnosis Date  . Basal cell carcinoma   . Benign localized prostatic hyperplasia with lower urinary tract symptoms (LUTS)   . CAD (coronary artery disease)    S/P CABG in 2001 with LIMA to the LAD, SVG to RI, SVG to left circumflex and SVG to PDA.  Marland Kitchen DCM (dilated cardiomyopathy) ([place]) 12/21/2018   Ischemic with EF 40-45% by Echo 11/2018  . Dizzy spells   . Erectile dysfunction   . Grief reaction   . Hypercholesterolemia   . Hypertension   . Myocardial infarction ([place])   . Peripheral vascular disease (HCC)    S/P right CFA endarterectomy and profundoplasty by Dr. Oneida Alar in 2018 with a prior history of aortobifem with left fem-BK popliteal graft.  Marland Kitchen PFO (patent foramen ovale)    on 12/06/16 echo [facility])  . Syncope and collapse    Past Surgical History:  Procedure Laterality Date  . ABDOMINAL AORTIC ANEURYSM REPAIR    . CORONARY ARTERY BYPASS GRAFT  2001    Median sternotomy for coronary artery bypass grafting x 4 (left internal mammary artery to distal left anterior descending coronary artery, saphenous vein graft to ramus intermediate branch, sequential saphenous vein graft to second circumflex  marginal branch, saphenous vein graft to posterior descending coronary artery).  . DUPUYTREN CONTRACTURE RELEASE Left 2012  . ENDARTERECTOMY FEMORAL Right 12/31/2016   Procedure: RIGHT FEMORAL ENDARTERECTOMY;  Surgeon: Elam Dutch, MD;  Location: [place];  Service: Vascular;  Laterality: Right;  . ENDARTERECTOMY FEMORAL Left 11/24/2018   Procedure: ENDARTERECTOMY FEMORAL WITH PROFUNDOPLASTY;  Surgeon: Elam Dutch, MD;  Location: [place];  Service: Vascular;  Laterality: Left;  . KNEE ARTHROSCOPY Right 2001  . LOWER EXTREMITY ANGIOGRAPHY N/A 12/07/2016   Procedure: Lower Extremity Angiography;  Surgeon: Adrian Prows, MD;  Location: [place] CV LAB;  Service: Cardiovascular;  Laterality: N/A;  . MULTIPLE TOOTH EXTRACTIONS    . NASAL SEPTUM SURGERY    . PATCH ANGIOPLASTY Right 12/31/2016   Procedure: PATCH PROFUNDOPLASTY, Right Femoral Profunda using Hemashield vascular patch.;  Surgeon: Elam Dutch, MD;  Location: [facility] OR;  Service: Vascular;  Laterality: Right;  . PATCH ANGIOPLASTY Left 11/24/2018   Procedure: Patch Angioplasty Left Common Femoral Artery;  Surgeon: Elam Dutch, MD;  Location: [place];  Service: Vascular;  Laterality: Left;  . THROMBECTOMY    . THROMBECTOMY ILIAC ARTERY Left 11/24/2018   Procedure: THROMBECTOMY LEFT LIMB OF AORTOBIFEMORAL GRAFT;  Surgeon: Elam Dutch, MD;  Location: [place];  Service: Vascular;  Laterality: Left;  . TONSILLECTOMY     Current Outpatient Medications on File Prior to Visit  Medication Sig Dispense Refill  . atorvastatin (LIPITOR) 10 MG tablet Take 10  mg by mouth daily.    . rivaroxaban (XARELTO) 20 MG TABS tablet Take 1 tablet (20 mg total) by mouth daily with supper. 30 tablet 6  . amiodarone (PACERONE) 200 MG tablet Take 1 tablet (200 mg total) by mouth daily. (Patient not taking: Reported on 01/02/2019) 90 tablet 3  . losartan (COZAAR) 25 MG tablet Take 0.5 tablets (12.5 mg total) by mouth daily. (Patient not taking: Reported on  01/02/2019) 45 tablet 3   No current facility-administered medications on file prior to visit.     Review of systems: He has no shortness of breath.  He has no chest pain.  He is very active overall.  Physical exam:  Vitals:   02/26/19 0850  BP: 137/71  Pulse: 68  Resp: 20  Temp: 97.8 F (36.6 C)  SpO2: 98%  Weight: 173 lb 14.4 oz (78.9 kg)  Height: 5\' 7"  (1.702 m)     Extremities: 2+ femoral pulses absent popliteal and pedal pulses bilaterally well-healed groin incisions  Data: Patient had bilateral ABIs performed today which were 0.6 bilaterally.  This is unchanged from August 2020.  Assessment: Patent femoral-femoral bypass with residual chronic SFA occlusions mild left leg claudication symptoms nondisabling  Plan: The patient will follow-up in 3 months time for repeat ABIs.  If his ABIs are fairly consistent at that point he can probably go to once yearly follow-up.  He will be seen in our APP clinic.  Ruta Hinds, MD [facility] Office: [phone]

## 2019-03-03 ENCOUNTER — Other Ambulatory Visit: Payer: Self-pay

## 2019-03-03 DIAGNOSIS — I739 Peripheral vascular disease, unspecified: Secondary | ICD-10-CM

## 2019-04-03 ENCOUNTER — Other Ambulatory Visit: Payer: Self-pay | Admitting: Vascular Surgery

## 2019-04-07 DIAGNOSIS — I739 Peripheral vascular disease, unspecified: Secondary | ICD-10-CM | POA: Diagnosis not present

## 2019-04-07 DIAGNOSIS — L03032 Cellulitis of left toe: Secondary | ICD-10-CM | POA: Diagnosis not present

## 2019-04-20 ENCOUNTER — Encounter: Payer: Self-pay | Admitting: Podiatry

## 2019-04-20 ENCOUNTER — Other Ambulatory Visit: Payer: Self-pay

## 2019-04-20 ENCOUNTER — Ambulatory Visit: Payer: Medicare PPO | Admitting: Podiatry

## 2019-04-20 ENCOUNTER — Ambulatory Visit (INDEPENDENT_AMBULATORY_CARE_PROVIDER_SITE_OTHER): Payer: Medicare PPO

## 2019-04-20 VITALS — BP 75/53 | HR 63

## 2019-04-20 DIAGNOSIS — M722 Plantar fascial fibromatosis: Secondary | ICD-10-CM

## 2019-04-20 DIAGNOSIS — G629 Polyneuropathy, unspecified: Secondary | ICD-10-CM

## 2019-04-20 MED ORDER — GABAPENTIN 300 MG PO CAPS: 300.0000 mg | ORAL_CAPSULE | Freq: Three times a day (TID) | ORAL | 3 refills | Status: DC

## 2019-04-20 NOTE — Progress Notes (Signed)
Subjective:   Patient ID: Frank Arias, male   DOB: 79 y.o.   MRN: [id number]   HPI Patient presents stating he has a lot of burning in the bottom of both his feet over the last few months and states it seems to gradually become more aggravating and seems to bother him all day.  Is not having significant pain but does have tingling also noted and does have vascular disease that he had worked on several times.  Patient does not smoke likes to be active if possible   Review of Systems  All other systems reviewed and are negative.       Objective:  Physical Exam Vitals and nursing note reviewed.  Constitutional:      Appearance: He is well-developed.  Pulmonary:     Effort: Pulmonary effort is normal.  Musculoskeletal:        General: Normal range of motion.  Skin:    General: Skin is warm.  Neurological:     Mental Status: He is alert.     Vascular status found to be diminished bilateral with patient actively being seen by vascular physician.  Patient is noted to have reasonably good digital perfusion is well oriented x3 and did not have neurological loss currently.  Patient is found to have mild discomfort plantar aspect of both feet     Assessment:  Inflammatory condition with vascular disease and possible neuropathic-like symptoms associated with both     Plan:  H&P x-rays reviewed and today I recommended gabapentin and weeks explained how to start this starting with 1 today followed by 2 in 3 and also I went ahead and I dispensed over-the-counter thick insoles to take pressure off the bottom of his feet.  Reappoint to recheck  X-rays indicated that there was no signs of stress fracture with moderate arthritis noted bilateral

## 2019-04-21 DIAGNOSIS — I4581 Long QT syndrome: Secondary | ICD-10-CM | POA: Diagnosis not present

## 2019-04-21 DIAGNOSIS — D649 Anemia, unspecified: Secondary | ICD-10-CM | POA: Diagnosis not present

## 2019-04-21 DIAGNOSIS — I25810 Atherosclerosis of coronary artery bypass graft(s) without angina pectoris: Secondary | ICD-10-CM | POA: Diagnosis not present

## 2019-04-21 DIAGNOSIS — E7849 Other hyperlipidemia: Secondary | ICD-10-CM | POA: Diagnosis not present

## 2019-04-21 DIAGNOSIS — Z1331 Encounter for screening for depression: Secondary | ICD-10-CM | POA: Diagnosis not present

## 2019-04-21 DIAGNOSIS — I739 Peripheral vascular disease, unspecified: Secondary | ICD-10-CM | POA: Diagnosis not present

## 2019-04-21 DIAGNOSIS — I4891 Unspecified atrial fibrillation: Secondary | ICD-10-CM | POA: Diagnosis not present

## 2019-05-28 ENCOUNTER — Encounter (HOSPITAL_COMMUNITY): Payer: Medicare HMO

## 2019-05-28 ENCOUNTER — Ambulatory Visit: Payer: Medicare HMO | Admitting: Vascular Surgery

## 2019-05-28 DIAGNOSIS — D649 Anemia, unspecified: Secondary | ICD-10-CM | POA: Diagnosis not present

## 2019-05-28 DIAGNOSIS — E7849 Other hyperlipidemia: Secondary | ICD-10-CM | POA: Diagnosis not present

## 2019-05-28 DIAGNOSIS — I25810 Atherosclerosis of coronary artery bypass graft(s) without angina pectoris: Secondary | ICD-10-CM | POA: Diagnosis not present

## 2019-05-29 DIAGNOSIS — D649 Anemia, unspecified: Secondary | ICD-10-CM | POA: Diagnosis not present

## 2019-05-29 DIAGNOSIS — F418 Other specified anxiety disorders: Secondary | ICD-10-CM | POA: Diagnosis not present

## 2019-05-29 DIAGNOSIS — F410 Panic disorder [episodic paroxysmal anxiety] without agoraphobia: Secondary | ICD-10-CM | POA: Diagnosis not present

## 2019-05-29 DIAGNOSIS — I4891 Unspecified atrial fibrillation: Secondary | ICD-10-CM | POA: Diagnosis not present

## 2019-05-29 DIAGNOSIS — R5383 Other fatigue: Secondary | ICD-10-CM | POA: Diagnosis not present

## 2019-06-01 DIAGNOSIS — I25810 Atherosclerosis of coronary artery bypass graft(s) without angina pectoris: Secondary | ICD-10-CM | POA: Diagnosis not present

## 2019-06-01 DIAGNOSIS — R5383 Other fatigue: Secondary | ICD-10-CM | POA: Diagnosis not present

## 2019-06-01 DIAGNOSIS — I4891 Unspecified atrial fibrillation: Secondary | ICD-10-CM | POA: Diagnosis not present

## 2019-06-01 DIAGNOSIS — F418 Other specified anxiety disorders: Secondary | ICD-10-CM | POA: Diagnosis not present

## 2019-06-01 DIAGNOSIS — F410 Panic disorder [episodic paroxysmal anxiety] without agoraphobia: Secondary | ICD-10-CM | POA: Diagnosis not present

## 2019-06-01 DIAGNOSIS — I4581 Long QT syndrome: Secondary | ICD-10-CM | POA: Diagnosis not present

## 2019-06-02 ENCOUNTER — Other Ambulatory Visit: Payer: Self-pay

## 2019-06-02 ENCOUNTER — Ambulatory Visit (HOSPITAL_COMMUNITY)
Admission: RE | Admit: 2019-06-02 | Discharge: 2019-06-02 | Disposition: A | Discharge status: Home / Self Care | Payer: Medicare PPO | Source: Ambulatory Visit | Attending: Nurse Practitioner | Admitting: Nurse Practitioner

## 2019-06-02 ENCOUNTER — Encounter (HOSPITAL_COMMUNITY): Payer: Self-pay | Admitting: Nurse Practitioner

## 2019-06-02 VITALS — BP 124/84 | HR 94 | Ht 67.0 in | Wt 172.2 lb

## 2019-06-02 DIAGNOSIS — E7800 Pure hypercholesterolemia, unspecified: Secondary | ICD-10-CM | POA: Diagnosis not present

## 2019-06-02 DIAGNOSIS — Z801 Family history of malignant neoplasm of trachea, bronchus and lung: Secondary | ICD-10-CM | POA: Diagnosis not present

## 2019-06-02 DIAGNOSIS — Z8249 Family history of ischemic heart disease and other diseases of the circulatory system: Secondary | ICD-10-CM | POA: Insufficient documentation

## 2019-06-02 DIAGNOSIS — Z85828 Personal history of other malignant neoplasm of skin: Secondary | ICD-10-CM | POA: Insufficient documentation

## 2019-06-02 DIAGNOSIS — I480 Paroxysmal atrial fibrillation: Secondary | ICD-10-CM | POA: Diagnosis not present

## 2019-06-02 DIAGNOSIS — I10 Essential (primary) hypertension: Secondary | ICD-10-CM | POA: Insufficient documentation

## 2019-06-02 DIAGNOSIS — I420 Dilated cardiomyopathy: Secondary | ICD-10-CM | POA: Diagnosis not present

## 2019-06-02 DIAGNOSIS — I25810 Atherosclerosis of coronary artery bypass graft(s) without angina pectoris: Secondary | ICD-10-CM | POA: Insufficient documentation

## 2019-06-02 DIAGNOSIS — D6869 Other thrombophilia: Secondary | ICD-10-CM | POA: Diagnosis not present

## 2019-06-02 DIAGNOSIS — Z79899 Other long term (current) drug therapy: Secondary | ICD-10-CM | POA: Diagnosis not present

## 2019-06-02 DIAGNOSIS — Z886 Allergy status to analgesic agent status: Secondary | ICD-10-CM | POA: Insufficient documentation

## 2019-06-02 DIAGNOSIS — I739 Peripheral vascular disease, unspecified: Secondary | ICD-10-CM | POA: Diagnosis not present

## 2019-06-02 DIAGNOSIS — I252 Old myocardial infarction: Secondary | ICD-10-CM | POA: Insufficient documentation

## 2019-06-02 DIAGNOSIS — Z951 Presence of aortocoronary bypass graft: Secondary | ICD-10-CM | POA: Diagnosis not present

## 2019-06-02 DIAGNOSIS — R9431 Abnormal electrocardiogram [ECG] [EKG]: Secondary | ICD-10-CM | POA: Diagnosis not present

## 2019-06-02 DIAGNOSIS — Z87891 Personal history of nicotine dependence: Secondary | ICD-10-CM | POA: Insufficient documentation

## 2019-06-02 DIAGNOSIS — Z7901 Long term (current) use of anticoagulants: Secondary | ICD-10-CM | POA: Insufficient documentation

## 2019-06-02 NOTE — Patient Instructions (Signed)
Increase metoprolol to 25mg twice a day

## 2019-06-02 NOTE — Progress Notes (Signed)
Primary Care Physician: Sueanne Margarita, DO Referring Physician: Dr. Shara Blazing is a 79 y.o. male with a h/o CAD, s/p bypass, PVD, recently with hospitalization of thrombectomy left iliac artery, 12/03/18, HTN, paroxysmal afib  that is in the afib clinic, referred by Dr. Radford Pax. She saw him post op, 12/24/18 and pt was doing well and was in [place] after being started on amiodarone with  runs of afib with RVR, prior to surgery. He did not down titrate his amiodarone and was still on 400 mg bid since discharge. The next day  he called in c/o extreme shakiness of his hands to the point he was having trouble eating/drinking. At that point he was told to stop drug. In the office today the shakiness has significantly diminished. He is in SR. He never felt the afib and does not want to go back on amiodarone or any heart drugs. "I feel fine." He does continue on xarelto.  F/u in afib clinic, 2/16/ 21. Pt's PCP called yesterday as pt was in office to discuss Panic attacks but was found to be in afib with RVR. I advised to start metoprolol 12.5 mg bid and today he remains in afib at 94 bpm. He has not felt his usual self for 2 weeks as far as energy goes.  He also describes other episodes that do sound more like  panic attacks that he reports  for   "years'. He will  all of a sudden feel  like he can't breathe, but can get in his car and ride down the road and the sensation goes away. He states that he has not missed any xarelto with a CHA2DS2VASc score of 5. The shaking of his hands went away after he stopped amiodarone.  Today, he denies symptoms of palpitations, chest pain, shortness of breath, orthopnea, PND, lower extremity edema, dizziness, presyncope, syncope, or neurologic sequela. The patient is tolerating medications without difficulties and is otherwise without complaint today.   Past Medical History:  Diagnosis Date  . Basal cell carcinoma   . Benign localized prostatic hyperplasia with  lower urinary tract symptoms (LUTS)   . CAD (coronary artery disease)    S/P CABG in 2001 with LIMA to the LAD, SVG to RI, SVG to left circumflex and SVG to PDA.  Marland Kitchen DCM (dilated cardiomyopathy) ([place]) 12/21/2018   Ischemic with EF 40-45% by Echo 11/2018  . Dizzy spells   . Erectile dysfunction   . Grief reaction   . Hypercholesterolemia   . Hypertension   . Myocardial infarction ([place])   . Peripheral vascular disease (HCC)    S/P right CFA endarterectomy and profundoplasty by Dr. Oneida Alar in 2018 with a prior history of aortobifem with left fem-BK popliteal graft.  Marland Kitchen PFO (patent foramen ovale)    on 12/06/16 echo [facility])  . Syncope and collapse    Past Surgical History:  Procedure Laterality Date  . ABDOMINAL AORTIC ANEURYSM REPAIR    . CORONARY ARTERY BYPASS GRAFT  2001    Median sternotomy for coronary artery bypass grafting x 4 (left internal mammary artery to distal left anterior descending coronary artery, saphenous vein graft to ramus intermediate branch, sequential saphenous vein graft to second circumflex marginal branch, saphenous vein graft to posterior descending coronary artery).  . DUPUYTREN CONTRACTURE RELEASE Left 2012  . ENDARTERECTOMY FEMORAL Right 12/31/2016   Procedure: RIGHT FEMORAL ENDARTERECTOMY;  Surgeon: Elam Dutch, MD;  Location: [place];  Service: Vascular;  Laterality:  Right;  Marland Kitchen ENDARTERECTOMY FEMORAL Left 11/24/2018   Procedure: ENDARTERECTOMY FEMORAL WITH PROFUNDOPLASTY;  Surgeon: Elam Dutch, MD;  Location: [place];  Service: Vascular;  Laterality: Left;  . KNEE ARTHROSCOPY Right 2001  . LOWER EXTREMITY ANGIOGRAPHY N/A 12/07/2016   Procedure: Lower Extremity Angiography;  Surgeon: Adrian Prows, MD;  Location: [place] CV LAB;  Service: Cardiovascular;  Laterality: N/A;  . MULTIPLE TOOTH EXTRACTIONS    . NASAL SEPTUM SURGERY    . PATCH ANGIOPLASTY Right 12/31/2016   Procedure: PATCH PROFUNDOPLASTY, Right Femoral Profunda using Hemashield  vascular patch.;  Surgeon: Elam Dutch, MD;  Location: [facility] OR;  Service: Vascular;  Laterality: Right;  . PATCH ANGIOPLASTY Left 11/24/2018   Procedure: Patch Angioplasty Left Common Femoral Artery;  Surgeon: Elam Dutch, MD;  Location: [place];  Service: Vascular;  Laterality: Left;  . THROMBECTOMY    . THROMBECTOMY ILIAC ARTERY Left 11/24/2018   Procedure: THROMBECTOMY LEFT LIMB OF AORTOBIFEMORAL GRAFT;  Surgeon: Elam Dutch, MD;  Location: [place];  Service: Vascular;  Laterality: Left;  . TONSILLECTOMY      Current Outpatient Medications  Medication Sig Dispense Refill  . atorvastatin (LIPITOR) 10 MG tablet TAKE 1 TABLET BY MOUTH EVERY DAY 90 tablet 1  . B Complex Vitamins (VITAMIN B COMPLEX PO) Taking one tablet by mouth daily    . metoprolol tartrate (LOPRESSOR) 25 MG tablet Take 25 mg by mouth 2 (two) times daily.    . Multiple Vitamins-Minerals (CENTRUM SILVER 50+MEN) TABS Take by mouth daily.    . rivaroxaban (XARELTO) 20 MG TABS tablet Take 1 tablet (20 mg total) by mouth daily with supper. 30 tablet 6   No current facility-administered medications for this encounter.    Allergies  Allergen Reactions  . Morphine And Related Other (See Comments)    CONFUSED    Social History   Socioeconomic History  . Marital status: Widowed    Spouse name: Not on file  . Number of children: Not on file  . Years of education: Not on file  . Highest education level: Not on file  Occupational History  . Not on file  Tobacco Use  . Smoking status: Former Smoker    Types: Cigarettes    Quit date: 04/16/1998    Years since quitting: 21.1  . Smokeless tobacco: Never Used  Substance and Sexual Activity  . Alcohol use: Yes    Comment: occasional  . Drug use: No  . Sexual activity: Not on file  Other Topics Concern  . Not on file  Social History Narrative  . Not on file   Social Determinants of Health   Financial Resource Strain:   . Difficulty of Paying Living  Expenses: Not on file  Food Insecurity:   . Worried About Charity fundraiser in the Last Year: Not on file  . Ran Out of Food in the Last Year: Not on file  Transportation Needs:   . Lack of Transportation (Medical): Not on file  . Lack of Transportation (Non-Medical): Not on file  Physical Activity:   . Days of Exercise per Week: Not on file  . Minutes of Exercise per Session: Not on file  Stress:   . Feeling of Stress : Not on file  Social Connections:   . Frequency of Communication with Friends and Family: Not on file  . Frequency of Social Gatherings with Friends and Family: Not on file  . Attends Religious Services: Not on file  .  Active Member of Clubs or Organizations: Not on file  . Attends Archivist Meetings: Not on file  . Marital Status: Not on file  Intimate Partner Violence:   . Fear of Current or Ex-Partner: Not on file  . Emotionally Abused: Not on file  . Physically Abused: Not on file  . Sexually Abused: Not on file    Family History  Problem Relation Age of Onset  . Coronary artery disease Other   . Lung cancer Other     ROS- All systems are reviewed and negative except as per the HPI above  Physical Exam: Vitals:   06/02/19 1427  BP: 124/84  Pulse: 94  Weight: 78.1 kg  Height: 5\' 7"  (1.702 m)   Wt Readings from Last 3 Encounters:  06/02/19 78.1 kg  02/26/19 78.9 kg  01/02/19 77.9 kg    Labs: Lab Results  Component Value Date   NA 138 11/25/2018   K 4.0 11/25/2018   CL 110 11/25/2018   CO2 20 (L) 11/25/2018   GLUCOSE 134 (H) 11/25/2018   BUN 12 11/25/2018   CREATININE 0.93 11/25/2018   CALCIUM 8.0 (L) 11/25/2018   Lab Results  Component Value Date   INR 1.2 11/24/2018   Lab Results  Component Value Date   CHOL 105 11/24/2018   HDL 47 11/24/2018   LDLCALC 49 11/24/2018   TRIG 44 11/24/2018     GEN- The patient is well appearing, alert and oriented x 3 today.   Head- normocephalic, atraumatic Eyes-  Sclera clear,  conjunctiva pink Ears- hearing intact Oropharynx- clear Neck- supple, no JVP Lymph- no cervical lymphadenopathy Lungs- Clear to ausculation bilaterally, normal work of breathing Heart- irregular rate and rhythm, no murmurs, rubs or gallops, PMI not laterally displaced GI- soft, NT, ND, + BS Extremities- no clubbing, cyanosis, or edema MS- no significant deformity or atrophy Skin- no rash or lesion Psych- euthymic mood, full affect Neuro- strength and sensation are intact  EKG- EKG  Shows afib at 94 bpm  Epic records revied Echo-IMPRESSIONS    1. The left ventricle is mildly dilated. The left ventricle has mild-moderately reduced systolic function, with an ejection fraction of 40-45%. Left ventricular diastolic Doppler parameters are consistent with indeterminate diastolic dysfunction. Basal  to mid inferior akinesis, basal inferoseptal hypokinesis.  2. There is mild mitral annular calcification present. No evidence of mitral valve stenosis. Mild mitral regurgitation.  3. The aortic valve is tricuspid Aortic valve regurgitation is trivial by color flow Doppler. No stenosis of the aortic valve.  4. The right ventricle has mildly reduced systolic function. The cavity was mildly enlarged. There is no increase in right ventricular wall thickness.  5. Left atrial size was mildly dilated.  6. Right atrial size was mildly dilated.  7. Limited echo.  FINDINGS  Left Ventricle: The left ventricle has mild-moderately reduced systolic function, with an ejection fraction of 40-45%. The cavity size was mildly dilated. There is no increase in left ventricular wall thickness. Left ventricular diastolic Doppler  parameters are consistent with indeterminate diastolic dysfunction. Definity contrast agent was given IV to delineate the left ventricular endocardial borders.  Assessment and Plan: 1. Paroxysmal afib Last fall, pt was started on amiodarone at 400 mg  Bid but failed to titrate down dose  and stayed on this  x one month   He had extreme shakiness of hands which  resolved with stopping amiodarone  He did not want to start back amiodarone at the time  He appears to have been in SR until the past 2 weeks(? ) He was started on metoprolol 12.5 mg bid and will increase to a full tablet bid today I have asked him to reduce beer intake to no more than 2 a week, he said he doubted that would happen  I will see back in one week and if still out of rhythm, plan for a cardioversion  2. CHA2DS2VASc score of at least 5 He is on xarelto 20 mg daily and reassures me he takes faithfully, no missed doses    F/u with Dr. Radford Pax as scheduled in 6 months and vascular per recall  afib clinic as needed  Butch Penny C. Blimie Vaness, [facility] [address] [place], [place] [address] [phone]

## 2019-06-04 ENCOUNTER — Ambulatory Visit: Payer: Medicare HMO | Admitting: Vascular Surgery

## 2019-06-04 ENCOUNTER — Ambulatory Visit: Payer: Self-pay | Admitting: Vascular Surgery

## 2019-06-04 ENCOUNTER — Encounter (HOSPITAL_COMMUNITY): Payer: Medicare HMO

## 2019-06-04 ENCOUNTER — Ambulatory Visit (HOSPITAL_COMMUNITY): Payer: Medicare HMO

## 2019-06-09 ENCOUNTER — Ambulatory Visit (HOSPITAL_COMMUNITY)
Admission: RE | Admit: 2019-06-09 | Discharge: 2019-06-09 | Disposition: A | Discharge status: Home / Self Care | Payer: Medicare PPO | Source: Ambulatory Visit | Attending: Nurse Practitioner | Admitting: Nurse Practitioner

## 2019-06-09 ENCOUNTER — Other Ambulatory Visit: Payer: Self-pay

## 2019-06-09 ENCOUNTER — Encounter (HOSPITAL_COMMUNITY): Payer: Self-pay | Admitting: Nurse Practitioner

## 2019-06-09 VITALS — BP 106/76 | HR 90 | Ht 67.0 in | Wt 170.6 lb

## 2019-06-09 DIAGNOSIS — Z951 Presence of aortocoronary bypass graft: Secondary | ICD-10-CM | POA: Insufficient documentation

## 2019-06-09 DIAGNOSIS — I252 Old myocardial infarction: Secondary | ICD-10-CM | POA: Diagnosis not present

## 2019-06-09 DIAGNOSIS — E7800 Pure hypercholesterolemia, unspecified: Secondary | ICD-10-CM | POA: Insufficient documentation

## 2019-06-09 DIAGNOSIS — I2510 Atherosclerotic heart disease of native coronary artery without angina pectoris: Secondary | ICD-10-CM | POA: Diagnosis not present

## 2019-06-09 DIAGNOSIS — I10 Essential (primary) hypertension: Secondary | ICD-10-CM | POA: Diagnosis not present

## 2019-06-09 DIAGNOSIS — I4892 Unspecified atrial flutter: Secondary | ICD-10-CM | POA: Insufficient documentation

## 2019-06-09 DIAGNOSIS — I480 Paroxysmal atrial fibrillation: Secondary | ICD-10-CM | POA: Insufficient documentation

## 2019-06-09 LAB — CBC
HCT: 37.7 % — ABNORMAL LOW (ref 39.0–52.0)
Hemoglobin: 11.8 g/dL — ABNORMAL LOW (ref 13.0–17.0)
MCH: 28.2 pg (ref 26.0–34.0)
MCHC: 31.3 g/dL (ref 30.0–36.0)
MCV: 90.2 fL (ref 80.0–100.0)
Platelets: 224 10*3/uL (ref 150–400)
RBC: 4.18 MIL/uL — ABNORMAL LOW (ref 4.22–5.81)
RDW: 14.1 % (ref 11.5–15.5)
WBC: 6.8 10*3/uL (ref 4.0–10.5)
nRBC: 0 % (ref 0.0–0.2)

## 2019-06-09 LAB — BASIC METABOLIC PANEL
Anion gap: 9 (ref 5–15)
BUN: 27 mg/dL — ABNORMAL HIGH (ref 8–23)
CO2: 22 mmol/L (ref 22–32)
Calcium: 9.1 mg/dL (ref 8.9–10.3)
Chloride: 109 mmol/L (ref 98–111)
Creatinine, Ser: 1.08 mg/dL (ref 0.61–1.24)
GFR calc Af Amer: >60 mL/min (ref >60)
GFR calc non Af Amer: >60 mL/min (ref >60)
Glucose, Bld: 97 mg/dL (ref 70–99)
Potassium: 3.9 mmol/L (ref 3.5–5.1)
Sodium: 140 mmol/L (ref 135–145)

## 2019-06-09 NOTE — H&P (View-Only) (Signed)
Primary Care Physician: Sueanne Margarita, DO Referring Physician: Dr. Shara Blazing is a 79 y.o. male with a h/o CAD, s/p bypass, PVD,   with hospitalization of thrombectomy left iliac artery, 12/03/18, HTN, paroxysmal afib  that is in the afib clinic, referred by Dr. Radford Pax. She saw him post op, 12/24/18 and pt was doing well and was in [place] after being started on amiodarone with  runs of afib with RVR, prior to surgery. He did not down titrate his amiodarone and was still on 400 mg bid since discharge. The next day  he called in c/o extreme shakiness of his hands to the point he was having trouble eating/drinking. At that point he was told to stop drug. In the office today the shakiness has significantly diminished. He is in SR. He never felt the afib and does not want to go back on amiodarone or any heart drugs. "I feel fine." He does continue on xarelto.  F/u in afib clinic, 2/16/ 21. Pt's PCP called yesterday as pt was in office to discuss Panic attacks but was found to be in afib with RVR. I advised to start metoprolol 12.5 mg bid and today he remains in afib at 94 bpm. He has not felt his usual self for 2 weeks as far as energy goes.  He also describes other episodes that do sound more like  panic attacks that he reports  for   "years'. He will  all of a sudden feel  like he can't breathe, but can get in his car and ride down the road and the sensation goes away. He states that he has not missed any xarelto with a CHA2DS2VASc score of 5. The shaking of his hands went away after he stopped amiodarone.  F/u in afib clinic, 06/09/19. He remains in rate controlled afib. He has been started on daily zoloft and prn  lorazepam   by PCP to handle panic attacks and he feels it is helping. He has stopped alcohol intake over the last week but he tells me this will not be forever. We discussed cardioversion and he wants to proceed. No missed xarelto doses.    Today, he denies symptoms of palpitations,  chest pain, shortness of breath, orthopnea, PND, lower extremity edema, dizziness, presyncope, syncope, or neurologic sequela. The patient is tolerating medications without difficulties and is otherwise without complaint today.   Past Medical History:  Diagnosis Date  . Basal cell carcinoma   . Benign localized prostatic hyperplasia with lower urinary tract symptoms (LUTS)   . CAD (coronary artery disease)    S/P CABG in 2001 with LIMA to the LAD, SVG to RI, SVG to left circumflex and SVG to PDA.  Marland Kitchen DCM (dilated cardiomyopathy) ([place]) 12/21/2018   Ischemic with EF 40-45% by Echo 11/2018  . Dizzy spells   . Erectile dysfunction   . Grief reaction   . Hypercholesterolemia   . Hypertension   . Myocardial infarction ([place])   . Peripheral vascular disease (HCC)    S/P right CFA endarterectomy and profundoplasty by Dr. Oneida Alar in 2018 with a prior history of aortobifem with left fem-BK popliteal graft.  Marland Kitchen PFO (patent foramen ovale)    on 12/06/16 echo [facility])  . Syncope and collapse    Past Surgical History:  Procedure Laterality Date  . ABDOMINAL AORTIC ANEURYSM REPAIR    . CORONARY ARTERY BYPASS GRAFT  2001    Median sternotomy for coronary artery bypass grafting x  4 (left internal mammary artery to distal left anterior descending coronary artery, saphenous vein graft to ramus intermediate branch, sequential saphenous vein graft to second circumflex marginal branch, saphenous vein graft to posterior descending coronary artery).  . DUPUYTREN CONTRACTURE RELEASE Left 2012  . ENDARTERECTOMY FEMORAL Right 12/31/2016   Procedure: RIGHT FEMORAL ENDARTERECTOMY;  Surgeon: Elam Dutch, MD;  Location: [place];  Service: Vascular;  Laterality: Right;  . ENDARTERECTOMY FEMORAL Left 11/24/2018   Procedure: ENDARTERECTOMY FEMORAL WITH PROFUNDOPLASTY;  Surgeon: Elam Dutch, MD;  Location: [place];  Service: Vascular;  Laterality: Left;  . KNEE ARTHROSCOPY Right 2001  . LOWER EXTREMITY  ANGIOGRAPHY N/A 12/07/2016   Procedure: Lower Extremity Angiography;  Surgeon: Adrian Prows, MD;  Location: [place] [facility];  Service: Cardiovascular;  Laterality: N/A;  . MULTIPLE TOOTH EXTRACTIONS    . NASAL SEPTUM SURGERY    . PATCH ANGIOPLASTY Right 12/31/2016   Procedure: PATCH PROFUNDOPLASTY, Right Femoral Profunda using Hemashield vascular patch.;  Surgeon: Elam Dutch, MD;  Location: [facility] OR;  Service: Vascular;  Laterality: Right;  . PATCH ANGIOPLASTY Left 11/24/2018   Procedure: Patch Angioplasty Left Common Femoral Artery;  Surgeon: Elam Dutch, MD;  Location: [place];  Service: Vascular;  Laterality: Left;  . THROMBECTOMY    . THROMBECTOMY ILIAC ARTERY Left 11/24/2018   Procedure: THROMBECTOMY LEFT LIMB OF AORTOBIFEMORAL GRAFT;  Surgeon: Elam Dutch, MD;  Location: [place];  Service: Vascular;  Laterality: Left;  . TONSILLECTOMY      Current Outpatient Medications  Medication Sig Dispense Refill  . atorvastatin (LIPITOR) 10 MG tablet TAKE 1 TABLET BY MOUTH EVERY DAY 90 tablet 1  . B Complex Vitamins (VITAMIN B COMPLEX PO) Taking one tablet by mouth daily    . LORazepam (ATIVAN) 0.5 MG tablet Take 1/2 tablet by mouth as needed for anxiety    . metoprolol tartrate (LOPRESSOR) 25 MG tablet Take 25 mg by mouth 2 (two) times daily.    . Multiple Vitamins-Minerals (CENTRUM SILVER 50+MEN) TABS Take by mouth daily.    . rivaroxaban (XARELTO) 20 MG TABS tablet Take 1 tablet (20 mg total) by mouth daily with supper. 30 tablet 6  . sertraline (ZOLOFT) 50 MG tablet Take 50 mg by mouth daily.      No current facility-administered medications for this encounter.    Allergies  Allergen Reactions  . Morphine And Related Other (See Comments)    CONFUSED    Social History   Socioeconomic History  . Marital status: Widowed    Spouse name: Not on file  . Number of children: Not on file  . Years of education: Not on file  . Highest education level: Not on file  Occupational  History  . Not on file  Tobacco Use  . Smoking status: Former Smoker    Types: Cigarettes    Quit date: 04/16/1998    Years since quitting: 21.1  . Smokeless tobacco: Never Used  Substance and Sexual Activity  . Alcohol use: Yes    Comment: occasional  . Drug use: No  . Sexual activity: Not on file  Other Topics Concern  . Not on file  Social History Narrative  . Not on file   Social Determinants of Health   Financial Resource Strain:   . Difficulty of Paying Living Expenses: Not on file  Food Insecurity:   . Worried About Charity fundraiser in the Last Year: Not on file  . Ran Out of Food  in the Last Year: Not on file  Transportation Needs:   . Lack of Transportation (Medical): Not on file  . Lack of Transportation (Non-Medical): Not on file  Physical Activity:   . Days of Exercise per Week: Not on file  . Minutes of Exercise per Session: Not on file  Stress:   . Feeling of Stress : Not on file  Social Connections:   . Frequency of Communication with Friends and Family: Not on file  . Frequency of Social Gatherings with Friends and Family: Not on file  . Attends Religious Services: Not on file  . Active Member of Clubs or Organizations: Not on file  . Attends Archivist Meetings: Not on file  . Marital Status: Not on file  Intimate Partner Violence:   . Fear of Current or Ex-Partner: Not on file  . Emotionally Abused: Not on file  . Physically Abused: Not on file  . Sexually Abused: Not on file    Family History  Problem Relation Age of Onset  . Coronary artery disease Other   . Lung cancer Other     ROS- All systems are reviewed and negative except as per the HPI above  Physical Exam: Vitals:   06/09/19 0836  BP: 106/76  Pulse: 90  Weight: 77.4 kg  Height: 5\' 7"  (1.702 m)   Wt Readings from Last 3 Encounters:  06/09/19 77.4 kg  06/02/19 78.1 kg  02/26/19 78.9 kg    Labs: Lab Results  Component Value Date   NA 138 11/25/2018   K 4.0  11/25/2018   CL 110 11/25/2018   CO2 20 (L) 11/25/2018   GLUCOSE 134 (H) 11/25/2018   BUN 12 11/25/2018   CREATININE 0.93 11/25/2018   CALCIUM 8.0 (L) 11/25/2018   Lab Results  Component Value Date   INR 1.2 11/24/2018   Lab Results  Component Value Date   CHOL 105 11/24/2018   HDL 47 11/24/2018   LDLCALC 49 11/24/2018   TRIG 44 11/24/2018     GEN- The patient is well appearing, alert and oriented x 3 today.   Head- normocephalic, atraumatic Eyes-  Sclera clear, conjunctiva pink Ears- hearing intact Oropharynx- clear Neck- supple, no JVP Lymph- no cervical lymphadenopathy Lungs- Clear to ausculation bilaterally, normal work of breathing Heart- irregular rate and rhythm, no murmurs, rubs or gallops, PMI not laterally displaced GI- soft, NT, ND, + BS Extremities- no clubbing, cyanosis, or edema MS- no significant deformity or atrophy Skin- no rash or lesion Psych- euthymic mood, full affect Neuro- strength and sensation are intact  EKG- EKG  Shows afib at 90  Bpm, qrs int 110 ms, qtc 486 ms Epic records revied Echo-IMPRESSIONS    1. The left ventricle is mildly dilated. The left ventricle has mild-moderately reduced systolic function, with an ejection fraction of 40-45%. Left ventricular diastolic Doppler parameters are consistent with indeterminate diastolic dysfunction. Basal  to mid inferior akinesis, basal inferoseptal hypokinesis.  2. There is mild mitral annular calcification present. No evidence of mitral valve stenosis. Mild mitral regurgitation.  3. The aortic valve is tricuspid Aortic valve regurgitation is trivial by color flow Doppler. No stenosis of the aortic valve.  4. The right ventricle has mildly reduced systolic function. The cavity was mildly enlarged. There is no increase in right ventricular wall thickness.  5. Left atrial size was mildly dilated.  6. Right atrial size was mildly dilated.  7. Limited echo.  FINDINGS  Left Ventricle: The left  ventricle  has mild-moderately reduced systolic function, with an ejection fraction of 40-45%. The cavity size was mildly dilated. There is no increase in left ventricular wall thickness. Left ventricular diastolic Doppler  parameters are consistent with indeterminate diastolic dysfunction. Definity contrast agent was given IV to delineate the left ventricular endocardial borders.  Assessment and Plan: 1. Paroxysmal afib Last fall, pt was started on amiodarone at 400 mg  bid but failed to titrate down dose and stayed on this dose   x one month   He had extreme shakiness of hands which  resolved with stopping amiodarone  He did not want to start back amiodarone at the time  He appears to have been in SR until the past 3 weeks(? ) Continue on metoprolol 25 mg bid but go back to 12.5 bid am of cardioversion as he runs in low 60's in SR Continue to limit beer intake  Risk vrs benefit of cardioversion explained to pt and he wants to proceed Cbc/bmet today/covid test scheduled  2. CHA2DS2VASc score of at least 5 He is on xarelto 20 mg daily and reassures me he takes faithfully, no missed doses   F/u with afib clinic in one week   Butch Penny C. Anabia Weatherwax, [facility] [address] [place], [place] [address] [phone]

## 2019-06-09 NOTE — Progress Notes (Addendum)
Primary Care Physician: Sueanne Margarita, DO Referring Physician: Dr. Shara Blazing is a 79 y.o. male with a h/o CAD, s/p bypass, PVD,   with hospitalization of thrombectomy left iliac artery, 12/03/18, HTN, paroxysmal afib  that is in the afib clinic, referred by Dr. Radford Pax. She saw him post op, 12/24/18 and pt was doing well and was in [place] after being started on amiodarone with  runs of afib with RVR, prior to surgery. He did not down titrate his amiodarone and was still on 400 mg bid since discharge. The next day  he called in c/o extreme shakiness of his hands to the point he was having trouble eating/drinking. At that point he was told to stop drug. In the office today the shakiness has significantly diminished. He is in SR. He never felt the afib and does not want to go back on amiodarone or any heart drugs. "I feel fine." He does continue on xarelto.  F/u in afib clinic, 2/16/ 21. Pt's PCP called yesterday as pt was in office to discuss Panic attacks but was found to be in afib with RVR. I advised to start metoprolol 12.5 mg bid and today he remains in afib at 94 bpm. He has not felt his usual self for 2 weeks as far as energy goes.  He also describes other episodes that do sound more like  panic attacks that he reports  for   "years'. He will  all of a sudden feel  like he can't breathe, but can get in his car and ride down the road and the sensation goes away. He states that he has not missed any xarelto with a CHA2DS2VASc score of 5. The shaking of his hands went away after he stopped amiodarone.  F/u in afib clinic, 06/09/19. He remains in rate controlled afib. He has been started on daily zoloft and prn  lorazepam   by PCP to handle panic attacks and he feels it is helping. He has stopped alcohol intake over the last week but he tells me this will not be forever. We discussed cardioversion and he wants to proceed. No missed xarelto doses.    Today, he denies symptoms of palpitations,  chest pain, shortness of breath, orthopnea, PND, lower extremity edema, dizziness, presyncope, syncope, or neurologic sequela. The patient is tolerating medications without difficulties and is otherwise without complaint today.   Past Medical History:  Diagnosis Date  . Basal cell carcinoma   . Benign localized prostatic hyperplasia with lower urinary tract symptoms (LUTS)   . CAD (coronary artery disease)    S/P CABG in 2001 with LIMA to the LAD, SVG to RI, SVG to left circumflex and SVG to PDA.  Marland Kitchen DCM (dilated cardiomyopathy) ([place]) 12/21/2018   Ischemic with EF 40-45% by Echo 11/2018  . Dizzy spells   . Erectile dysfunction   . Grief reaction   . Hypercholesterolemia   . Hypertension   . Myocardial infarction ([place])   . Peripheral vascular disease (HCC)    S/P right CFA endarterectomy and profundoplasty by Dr. Oneida Alar in 2018 with a prior history of aortobifem with left fem-BK popliteal graft.  Marland Kitchen PFO (patent foramen ovale)    on 12/06/16 echo [facility])  . Syncope and collapse    Past Surgical History:  Procedure Laterality Date  . ABDOMINAL AORTIC ANEURYSM REPAIR    . CORONARY ARTERY BYPASS GRAFT  2001    Median sternotomy for coronary artery bypass grafting x  4 (left internal mammary artery to distal left anterior descending coronary artery, saphenous vein graft to ramus intermediate branch, sequential saphenous vein graft to second circumflex marginal branch, saphenous vein graft to posterior descending coronary artery).  . DUPUYTREN CONTRACTURE RELEASE Left 2012  . ENDARTERECTOMY FEMORAL Right 12/31/2016   Procedure: RIGHT FEMORAL ENDARTERECTOMY;  Surgeon: Elam Dutch, MD;  Location: [place];  Service: Vascular;  Laterality: Right;  . ENDARTERECTOMY FEMORAL Left 11/24/2018   Procedure: ENDARTERECTOMY FEMORAL WITH PROFUNDOPLASTY;  Surgeon: Elam Dutch, MD;  Location: [place];  Service: Vascular;  Laterality: Left;  . KNEE ARTHROSCOPY Right 2001  . LOWER EXTREMITY  ANGIOGRAPHY N/A 12/07/2016   Procedure: Lower Extremity Angiography;  Surgeon: Adrian Prows, MD;  Location: [place] CV [facility];  Service: Cardiovascular;  Laterality: N/A;  . MULTIPLE TOOTH EXTRACTIONS    . NASAL SEPTUM SURGERY    . PATCH ANGIOPLASTY Right 12/31/2016   Procedure: PATCH PROFUNDOPLASTY, Right Femoral Profunda using Hemashield vascular patch.;  Surgeon: Elam Dutch, MD;  Location: [facility] OR;  Service: Vascular;  Laterality: Right;  . PATCH ANGIOPLASTY Left 11/24/2018   Procedure: Patch Angioplasty Left Common Femoral Artery;  Surgeon: Elam Dutch, MD;  Location: [place];  Service: Vascular;  Laterality: Left;  . THROMBECTOMY    . THROMBECTOMY ILIAC ARTERY Left 11/24/2018   Procedure: THROMBECTOMY LEFT LIMB OF AORTOBIFEMORAL GRAFT;  Surgeon: Elam Dutch, MD;  Location: [place];  Service: Vascular;  Laterality: Left;  . TONSILLECTOMY      Current Outpatient Medications  Medication Sig Dispense Refill  . atorvastatin (LIPITOR) 10 MG tablet TAKE 1 TABLET BY MOUTH EVERY DAY 90 tablet 1  . B Complex Vitamins (VITAMIN B COMPLEX PO) Taking one tablet by mouth daily    . LORazepam (ATIVAN) 0.5 MG tablet Take 1/2 tablet by mouth as needed for anxiety    . metoprolol tartrate (LOPRESSOR) 25 MG tablet Take 25 mg by mouth 2 (two) times daily.    . Multiple Vitamins-Minerals (CENTRUM SILVER 50+MEN) TABS Take by mouth daily.    . rivaroxaban (XARELTO) 20 MG TABS tablet Take 1 tablet (20 mg total) by mouth daily with supper. 30 tablet 6  . sertraline (ZOLOFT) 50 MG tablet Take 50 mg by mouth daily.      No current facility-administered medications for this encounter.    Allergies  Allergen Reactions  . Morphine And Related Other (See Comments)    CONFUSED    Social History   Socioeconomic History  . Marital status: Widowed    Spouse name: Not on file  . Number of children: Not on file  . Years of education: Not on file  . Highest education level: Not on file  Occupational  History  . Not on file  Tobacco Use  . Smoking status: Former Smoker    Types: Cigarettes    Quit date: 04/16/1998    Years since quitting: 21.1  . Smokeless tobacco: Never Used  Substance and Sexual Activity  . Alcohol use: Yes    Comment: occasional  . Drug use: No  . Sexual activity: Not on file  Other Topics Concern  . Not on file  Social History Narrative  . Not on file   Social Determinants of Health   Financial Resource Strain:   . Difficulty of Paying Living Expenses: Not on file  Food Insecurity:   . Worried About Charity fundraiser in the Last Year: Not on file  . Ran Out of Food  in the Last Year: Not on file  Transportation Needs:   . Lack of Transportation (Medical): Not on file  . Lack of Transportation (Non-Medical): Not on file  Physical Activity:   . Days of Exercise per Week: Not on file  . Minutes of Exercise per Session: Not on file  Stress:   . Feeling of Stress : Not on file  Social Connections:   . Frequency of Communication with Friends and Family: Not on file  . Frequency of Social Gatherings with Friends and Family: Not on file  . Attends Religious Services: Not on file  . Active Member of Clubs or Organizations: Not on file  . Attends Archivist Meetings: Not on file  . Marital Status: Not on file  Intimate Partner Violence:   . Fear of Current or Ex-Partner: Not on file  . Emotionally Abused: Not on file  . Physically Abused: Not on file  . Sexually Abused: Not on file    Family History  Problem Relation Age of Onset  . Coronary artery disease Other   . Lung cancer Other     ROS- All systems are reviewed and negative except as per the HPI above  Physical Exam: Vitals:   06/09/19 0836  BP: 106/76  Pulse: 90  Weight: 77.4 kg  Height: 5\' 7"  (1.702 m)   Wt Readings from Last 3 Encounters:  06/09/19 77.4 kg  06/02/19 78.1 kg  02/26/19 78.9 kg    Labs: Lab Results  Component Value Date   NA 138 11/25/2018   K 4.0  11/25/2018   CL 110 11/25/2018   CO2 20 (L) 11/25/2018   GLUCOSE 134 (H) 11/25/2018   BUN 12 11/25/2018   CREATININE 0.93 11/25/2018   CALCIUM 8.0 (L) 11/25/2018   Lab Results  Component Value Date   INR 1.2 11/24/2018   Lab Results  Component Value Date   CHOL 105 11/24/2018   HDL 47 11/24/2018   LDLCALC 49 11/24/2018   TRIG 44 11/24/2018     GEN- The patient is well appearing, alert and oriented x 3 today.   Head- normocephalic, atraumatic Eyes-  Sclera clear, conjunctiva pink Ears- hearing intact Oropharynx- clear Neck- supple, no JVP Lymph- no cervical lymphadenopathy Lungs- Clear to ausculation bilaterally, normal work of breathing Heart- irregular rate and rhythm, no murmurs, rubs or gallops, PMI not laterally displaced GI- soft, NT, ND, + BS Extremities- no clubbing, cyanosis, or edema MS- no significant deformity or atrophy Skin- no rash or lesion Psych- euthymic mood, full affect Neuro- strength and sensation are intact  EKG- EKG  Shows afib at 90  Bpm, qrs int 110 ms, qtc 486 ms Epic records revied Echo-IMPRESSIONS    1. The left ventricle is mildly dilated. The left ventricle has mild-moderately reduced systolic function, with an ejection fraction of 40-45%. Left ventricular diastolic Doppler parameters are consistent with indeterminate diastolic dysfunction. Basal  to mid inferior akinesis, basal inferoseptal hypokinesis.  2. There is mild mitral annular calcification present. No evidence of mitral valve stenosis. Mild mitral regurgitation.  3. The aortic valve is tricuspid Aortic valve regurgitation is trivial by color flow Doppler. No stenosis of the aortic valve.  4. The right ventricle has mildly reduced systolic function. The cavity was mildly enlarged. There is no increase in right ventricular wall thickness.  5. Left atrial size was mildly dilated.  6. Right atrial size was mildly dilated.  7. Limited echo.  FINDINGS  Left Ventricle: The left  ventricle  has mild-moderately reduced systolic function, with an ejection fraction of 40-45%. The cavity size was mildly dilated. There is no increase in left ventricular wall thickness. Left ventricular diastolic Doppler  parameters are consistent with indeterminate diastolic dysfunction. Definity contrast agent was given IV to delineate the left ventricular endocardial borders.  Assessment and Plan: 1. Paroxysmal afib Last fall, pt was started on amiodarone at 400 mg  bid but failed to titrate down dose and stayed on this dose   x one month   He had extreme shakiness of hands which  resolved with stopping amiodarone  He did not want to start back amiodarone at the time  He appears to have been in SR until the past 3 weeks(? ) Continue on metoprolol 25 mg bid but go back to 12.5 bid am of cardioversion as he runs in low 60's in SR Continue to limit beer intake  Risk vrs benefit of cardioversion explained to pt and he wants to proceed Cbc/bmet today/covid test scheduled  2. CHA2DS2VASc score of at least 5 He is on xarelto 20 mg daily and reassures me he takes faithfully, no missed doses   F/u with afib clinic in one week   Butch Penny C. Braxtin Bamba, [facility] [address] [place], [place] [address] [phone]

## 2019-06-09 NOTE — Patient Instructions (Signed)
Morning of Cardioversion: decrease Metoprolol dose to 1/2 tablet twice daily  Cardioversion scheduled for March 4th 2021  - Arrive at the [facility] and go to admitting at 10:30am  -Do not eat or drink anything after midnight the night prior to your procedure.  - Take all your medication with a sip of water prior to arrival.  - You will not be able to drive home after your procedure.

## 2019-06-09 NOTE — Addendum Note (Signed)
Encounter addended by: Enid Derry, CMA on: 06/09/2019 9:48 AM  Actions taken: Visit diagnoses modified, Order list changed, Diagnosis association updated

## 2019-06-09 NOTE — Addendum Note (Signed)
Encounter addended by: Enid Derry, CMA on: 06/09/2019 9:57 AM  Actions taken: Order Reconciliation Section accessed, Patient-reported medication modified

## 2019-06-15 ENCOUNTER — Other Ambulatory Visit (HOSPITAL_COMMUNITY)
Admission: RE | Admit: 2019-06-15 | Discharge: 2019-06-15 | Disposition: A | Discharge status: Home / Self Care | Payer: Medicare PPO | Source: Ambulatory Visit | Attending: Internal Medicine | Admitting: Internal Medicine

## 2019-06-15 DIAGNOSIS — I480 Paroxysmal atrial fibrillation: Secondary | ICD-10-CM

## 2019-06-15 DIAGNOSIS — Z20822 Contact with and (suspected) exposure to covid-19: Secondary | ICD-10-CM | POA: Insufficient documentation

## 2019-06-15 DIAGNOSIS — Z01812 Encounter for preprocedural laboratory examination: Secondary | ICD-10-CM | POA: Diagnosis not present

## 2019-06-15 LAB — SARS CORONAVIRUS 2 (TAT 6-24 HRS): SARS Coronavirus 2: NEGATIVE

## 2019-06-16 DIAGNOSIS — F1010 Alcohol abuse, uncomplicated: Secondary | ICD-10-CM | POA: Diagnosis not present

## 2019-06-16 DIAGNOSIS — F418 Other specified anxiety disorders: Secondary | ICD-10-CM | POA: Diagnosis not present

## 2019-06-16 DIAGNOSIS — I4891 Unspecified atrial fibrillation: Secondary | ICD-10-CM | POA: Diagnosis not present

## 2019-06-16 DIAGNOSIS — F410 Panic disorder [episodic paroxysmal anxiety] without agoraphobia: Secondary | ICD-10-CM | POA: Diagnosis not present

## 2019-06-16 DIAGNOSIS — I25810 Atherosclerosis of coronary artery bypass graft(s) without angina pectoris: Secondary | ICD-10-CM | POA: Diagnosis not present

## 2019-06-16 DIAGNOSIS — R5383 Other fatigue: Secondary | ICD-10-CM | POA: Diagnosis not present

## 2019-06-18 ENCOUNTER — Encounter (HOSPITAL_COMMUNITY)
Admission: RE | Disposition: A | Discharge status: Home / Self Care | Payer: Self-pay | Source: Home / Self Care | Attending: Internal Medicine

## 2019-06-18 ENCOUNTER — Encounter (HOSPITAL_COMMUNITY): Payer: Self-pay | Admitting: Internal Medicine

## 2019-06-18 ENCOUNTER — Other Ambulatory Visit: Payer: Self-pay

## 2019-06-18 ENCOUNTER — Ambulatory Visit (HOSPITAL_COMMUNITY): Payer: Medicare PPO | Admitting: Certified Registered"

## 2019-06-18 ENCOUNTER — Ambulatory Visit (HOSPITAL_COMMUNITY)
Admission: RE | Admit: 2019-06-18 | Discharge: 2019-06-18 | Disposition: A | Discharge status: Home / Self Care | Payer: Medicare PPO | Attending: Internal Medicine | Admitting: Internal Medicine

## 2019-06-18 DIAGNOSIS — I2510 Atherosclerotic heart disease of native coronary artery without angina pectoris: Secondary | ICD-10-CM | POA: Insufficient documentation

## 2019-06-18 DIAGNOSIS — Q211 Atrial septal defect: Secondary | ICD-10-CM | POA: Diagnosis not present

## 2019-06-18 DIAGNOSIS — Z79899 Other long term (current) drug therapy: Secondary | ICD-10-CM | POA: Diagnosis not present

## 2019-06-18 DIAGNOSIS — I10 Essential (primary) hypertension: Secondary | ICD-10-CM | POA: Insufficient documentation

## 2019-06-18 DIAGNOSIS — I25810 Atherosclerosis of coronary artery bypass graft(s) without angina pectoris: Secondary | ICD-10-CM | POA: Diagnosis not present

## 2019-06-18 DIAGNOSIS — I420 Dilated cardiomyopathy: Secondary | ICD-10-CM | POA: Insufficient documentation

## 2019-06-18 DIAGNOSIS — I480 Paroxysmal atrial fibrillation: Secondary | ICD-10-CM | POA: Insufficient documentation

## 2019-06-18 DIAGNOSIS — Z87891 Personal history of nicotine dependence: Secondary | ICD-10-CM | POA: Diagnosis not present

## 2019-06-18 DIAGNOSIS — I252 Old myocardial infarction: Secondary | ICD-10-CM | POA: Diagnosis not present

## 2019-06-18 DIAGNOSIS — Z7901 Long term (current) use of anticoagulants: Secondary | ICD-10-CM | POA: Insufficient documentation

## 2019-06-18 DIAGNOSIS — I4891 Unspecified atrial fibrillation: Secondary | ICD-10-CM

## 2019-06-18 DIAGNOSIS — E7800 Pure hypercholesterolemia, unspecified: Secondary | ICD-10-CM | POA: Insufficient documentation

## 2019-06-18 HISTORY — PX: CARDIOVERSION: SHX1299

## 2019-06-18 SURGERY — CARDIOVERSION
Anesthesia: General

## 2019-06-18 MED ORDER — SODIUM CHLORIDE 0.9 % IV SOLN
INTRAVENOUS | Status: DC
  Administered 2019-06-18: 08:00:00 500 mL via INTRAVENOUS

## 2019-06-18 MED ORDER — PROPOFOL 10 MG/ML IV BOLUS
INTRAVENOUS | Status: DC | PRN
  Administered 2019-06-18: 70 mg via INTRAVENOUS

## 2019-06-18 MED ORDER — SODIUM CHLORIDE 0.9 % IV SOLN: INTRAVENOUS | Status: DC | PRN

## 2019-06-18 MED ORDER — PHENYLEPHRINE HCL (PRESSORS) 10 MG/ML IV SOLN
INTRAVENOUS | Status: DC | PRN
  Administered 2019-06-18 (×5): 80 ug via INTRAVENOUS

## 2019-06-18 MED ORDER — LIDOCAINE 2% (20 MG/ML) 5 ML SYRINGE
INTRAMUSCULAR | Status: DC | PRN
  Administered 2019-06-18: 100 mg via INTRAVENOUS

## 2019-06-18 NOTE — Anesthesia Procedure Notes (Signed)
Procedure Name: MAC Performed by: Amadeo Garnet, CRNA Pre-anesthesia Checklist: Patient identified, Emergency Drugs available, Suction available and Patient being monitored Patient Re-evaluated:Patient Re-evaluated prior to induction Oxygen Delivery Method: Nasal cannula Preoxygenation: Pre-oxygenation with 100% oxygen Induction Type: IV induction Placement Confirmation: positive ETCO2 Dental Injury: Teeth and Oropharynx as per pre-operative assessment

## 2019-06-18 NOTE — CV Procedure (Signed)
CARDIOVERSION  Risks / benefits described  Pt understands and agrees to proceed.  He says he has not missed any Xarelto doses  Pt sedated by anesthesia with Propofol intravenously  With pads in AP position, patient cardioverted with 200 J synchronized biphasic energy  Procedure without complications    12 lead EKG pending   Dorris Carnes MD

## 2019-06-18 NOTE — Anesthesia Preprocedure Evaluation (Signed)
Anesthesia Evaluation  Patient identified by MRN, date of birth, ID band Patient awake    Reviewed: Allergy & Precautions, NPO status , Patient's Chart, lab work & pertinent test results  Airway Mallampati: I  TM Distance: >3 FB Neck ROM: Full    Dental   Pulmonary former smoker,    Pulmonary exam normal        Cardiovascular hypertension, Pt. on medications + CAD, + Past MI and + CABG  Normal cardiovascular exam     Neuro/Psych    GI/Hepatic   Endo/Other    Renal/GU      Musculoskeletal   Abdominal   Peds  Hematology   Anesthesia Other Findings   Reproductive/Obstetrics                             Anesthesia Physical Anesthesia Plan  ASA: III  Anesthesia Plan: General   Post-op Pain Management:    Induction: Intravenous  PONV Risk Score and Plan: 2  Airway Management Planned: Mask  Additional Equipment:   Intra-op Plan:   Post-operative Plan:   Informed Consent: I have reviewed the patients History and Physical, chart, labs and discussed the procedure including the risks, benefits and alternatives for the proposed anesthesia with the patient or authorized representative who has indicated his/her understanding and acceptance.       Plan Discussed with: CRNA and Surgeon  Anesthesia Plan Comments:         Anesthesia Quick Evaluation

## 2019-06-18 NOTE — Interval H&P Note (Signed)
History and Physical Interval Note:  06/18/2019 8:14 AM  Frank Arias  has presented today for surgery, with the diagnosis of A-FIB.  The various methods of treatment have been discussed with the patient and family. After consideration of risks, benefits and other options for treatment, the patient has consented to  Procedure(s): CARDIOVERSION (N/A) as a surgical intervention.  The patient's history has been reviewed, patient examined, no change in status, stable for surgery.  I have reviewed the patient's chart and labs.  Questions were answered to the patient's satisfaction.     Dorris Carnes

## 2019-06-18 NOTE — Transfer of Care (Signed)
Immediate Anesthesia Transfer of Care Note  Patient: Frank Arias  Procedure(s) Performed: CARDIOVERSION (N/A )  Patient Location: Endoscopy Unit  Anesthesia Type:MAC  Level of Consciousness: awake, alert  and oriented  Airway & Oxygen Therapy: Patient Spontanous Breathing  Post-op Assessment: Report given to RN, Post -op Vital signs reviewed and stable and Patient moving all extremities  Post vital signs: Reviewed and stable  Last Vitals:  Vitals Value Taken Time  BP 79/53 06/18/19 0853  Temp    Pulse 70 06/18/19 0853  Resp 22 06/18/19 0853  SpO2 96 % 06/18/19 0853    Last Pain:  Vitals:   06/18/19 0853  TempSrc:   PainSc: 0-No pain         Complications: No apparent anesthesia complications

## 2019-06-18 NOTE — Anesthesia Postprocedure Evaluation (Signed)
Anesthesia Post Note  Patient: Frank Arias  Procedure(s) Performed: CARDIOVERSION (N/A )     Patient location during evaluation: PACU Anesthesia Type: General Level of consciousness: awake and alert Pain management: pain level controlled Vital Signs Assessment: post-procedure vital signs reviewed and stable Respiratory status: spontaneous breathing, nonlabored ventilation, respiratory function stable and patient connected to nasal cannula oxygen Cardiovascular status: stable and blood pressure returned to baseline Postop Assessment: no apparent nausea or vomiting Anesthetic complications: no    Last Vitals:  Vitals:   06/18/19 0920 06/18/19 0930  BP: 94/65 99/69  Pulse: 73 75  Resp: 18 20  Temp:    SpO2: 97% 97%    Last Pain:  Vitals:   06/18/19 0930  TempSrc:   PainSc: 0-No pain                 Loralyn Rachel DAVID

## 2019-06-19 NOTE — Progress Notes (Signed)
Post call completed. Pts daughter expressing that pt seems more SOB than usual. Instructions given to follow up with primary cardiologist for symptoms.

## 2019-06-26 ENCOUNTER — Other Ambulatory Visit: Payer: Self-pay

## 2019-06-26 ENCOUNTER — Ambulatory Visit (HOSPITAL_COMMUNITY)
Admission: RE | Admit: 2019-06-26 | Discharge: 2019-06-26 | Disposition: A | Discharge status: Home / Self Care | Payer: Medicare PPO | Source: Ambulatory Visit | Attending: Nurse Practitioner | Admitting: Nurse Practitioner

## 2019-06-26 ENCOUNTER — Encounter (HOSPITAL_COMMUNITY): Payer: Self-pay | Admitting: Nurse Practitioner

## 2019-06-26 VITALS — BP 130/90 | HR 88 | Ht 67.0 in | Wt 170.6 lb

## 2019-06-26 DIAGNOSIS — Z79899 Other long term (current) drug therapy: Secondary | ICD-10-CM | POA: Insufficient documentation

## 2019-06-26 DIAGNOSIS — I2510 Atherosclerotic heart disease of native coronary artery without angina pectoris: Secondary | ICD-10-CM | POA: Diagnosis not present

## 2019-06-26 DIAGNOSIS — D6869 Other thrombophilia: Secondary | ICD-10-CM

## 2019-06-26 DIAGNOSIS — E7800 Pure hypercholesterolemia, unspecified: Secondary | ICD-10-CM | POA: Diagnosis not present

## 2019-06-26 DIAGNOSIS — I4819 Other persistent atrial fibrillation: Secondary | ICD-10-CM

## 2019-06-26 DIAGNOSIS — Z951 Presence of aortocoronary bypass graft: Secondary | ICD-10-CM | POA: Diagnosis not present

## 2019-06-26 DIAGNOSIS — I480 Paroxysmal atrial fibrillation: Secondary | ICD-10-CM | POA: Insufficient documentation

## 2019-06-26 DIAGNOSIS — I252 Old myocardial infarction: Secondary | ICD-10-CM | POA: Insufficient documentation

## 2019-06-26 DIAGNOSIS — I10 Essential (primary) hypertension: Secondary | ICD-10-CM | POA: Insufficient documentation

## 2019-06-26 DIAGNOSIS — Z87891 Personal history of nicotine dependence: Secondary | ICD-10-CM | POA: Insufficient documentation

## 2019-06-26 DIAGNOSIS — Z7901 Long term (current) use of anticoagulants: Secondary | ICD-10-CM | POA: Diagnosis not present

## 2019-06-26 NOTE — Progress Notes (Signed)
Primary Care Physician: Sueanne Margarita, DO Referring Physician: Dr. Shara Blazing is a 79 y.o. male with a h/o CAD, s/p bypass, PVD,   with hospitalization of thrombectomy left iliac artery, 12/03/18, HTN, paroxysmal afib  that is in the afib clinic, referred by Dr. Radford Pax. She  saw him post op, 12/24/18 and pt was doing well and was in [place] after being started on amiodarone with  runs of afib with RVR, prior to surgery. He did not down titrate his amiodarone and was still on 400 mg bid since discharge. The next day  he called in c/o extreme shakiness of his hands to the point he was having trouble eating/drinking. At that point he was told to stop drug. In the office today the shakiness has significantly diminished. He is in SR. He never felt the afib and does not want to go back on amiodarone or any heart drugs. "I feel fine." He does continue on xarelto.  F/u in afib clinic, 2/16/ 21. Pt's PCP called yesterday as pt was in office to discuss Panic attacks but was found to be in afib with RVR. I advised to start metoprolol 12.5 mg bid and today he remains in afib at 94 bpm. He has not felt his usual self for 2 weeks as far as energy goes.  He also describes other episodes that do sound more like  panic attacks that he reports  for   "years'. He will  all of a sudden feel  like he can't breathe, but can get in his car and ride down the road and the sensation goes away. He states that he has not missed any xarelto with a CHA2DS2VASc score of 5. The shaking of his hands went away after he stopped amiodarone.  F/u in afib clinic, 06/09/19. He remains in rate controlled afib. He has been started on daily zoloft and prn  lorazepam   by PCP to handle panic attacks and he feels it is helping. He has stopped alcohol intake over the last week but he tells me this will not be forever. We discussed cardioversion and he wants to proceed. No missed xarelto doses.    F/u in afib clinic, 06/26/19. He had a  successful cardioversion but  ekg shows  afib with controlled v rate at 88 bpm.  He continues on metoprolol at  25 mg bid. He states that he feels no different in afib than right after the cardioversion. I reviewed options with him, to continue rate control as his  strategy going forward, revisit amiodarone but at a lower dose or tikosyn as his EF by echo was 40-45% last August. He is clear that he feels fine and he prefers to go ahead with rate control strategy. He seems to be more concerned with his panic attacks than anything else. There are plans in place to start him on lexapro per the daughter.   Today, he denies symptoms of palpitations, chest pain, shortness of breath, orthopnea, PND, lower extremity edema, dizziness, presyncope, syncope, or neurologic sequela. The patient is tolerating medications without difficulties and is otherwise without complaint today.   Past Medical History:  Diagnosis Date  . Basal cell carcinoma   . Benign localized prostatic hyperplasia with lower urinary tract symptoms (LUTS)   . CAD (coronary artery disease)    S/P CABG in 2001 with LIMA to the LAD, SVG to RI, SVG to left circumflex and SVG to PDA.  Marland Kitchen DCM (dilated cardiomyopathy) ([place])  12/21/2018   Ischemic with EF 40-45% by Echo 11/2018  . Dizzy spells   . Erectile dysfunction   . Grief reaction   . Hypercholesterolemia   . Hypertension   . Myocardial infarction ([place])   . Peripheral vascular disease (HCC)    S/P right CFA endarterectomy and profundoplasty by Dr. Oneida Alar in 2018 with a prior history of aortobifem with left fem-BK popliteal graft.  Marland Kitchen PFO (patent foramen ovale)    on 12/06/16 echo [facility])  . Syncope and collapse    Past Surgical History:  Procedure Laterality Date  . ABDOMINAL AORTIC ANEURYSM REPAIR    . CARDIOVERSION N/A 06/18/2019   Procedure: CARDIOVERSION;  Surgeon: Fay Records, MD;  Location: [facility] ENDOSCOPY;  Service: Cardiovascular;  Laterality: N/A;  . CORONARY  ARTERY BYPASS GRAFT  2001    Median sternotomy for coronary artery bypass grafting x 4 (left internal mammary artery to distal left anterior descending coronary artery, saphenous vein graft to ramus intermediate branch, sequential saphenous vein graft to second circumflex marginal branch, saphenous vein graft to posterior descending coronary artery).  . DUPUYTREN CONTRACTURE RELEASE Left 2012  . ENDARTERECTOMY FEMORAL Right 12/31/2016   Procedure: RIGHT FEMORAL ENDARTERECTOMY;  Surgeon: Elam Dutch, MD;  Location: [place];  Service: Vascular;  Laterality: Right;  . ENDARTERECTOMY FEMORAL Left 11/24/2018   Procedure: ENDARTERECTOMY FEMORAL WITH PROFUNDOPLASTY;  Surgeon: Elam Dutch, MD;  Location: [place];  Service: Vascular;  Laterality: Left;  . KNEE ARTHROSCOPY Right 2001  . LOWER EXTREMITY ANGIOGRAPHY N/A 12/07/2016   Procedure: Lower Extremity Angiography;  Surgeon: Adrian Prows, MD;  Location: [place] CV LAB;  Service: Cardiovascular;  Laterality: N/A;  . MULTIPLE TOOTH EXTRACTIONS    . NASAL SEPTUM SURGERY    . PATCH ANGIOPLASTY Right 12/31/2016   Procedure: PATCH PROFUNDOPLASTY, Right Femoral Profunda using Hemashield vascular patch.;  Surgeon: Elam Dutch, MD;  Location: [facility] OR;  Service: Vascular;  Laterality: Right;  . PATCH ANGIOPLASTY Left 11/24/2018   Procedure: Patch Angioplasty Left Common Femoral Artery;  Surgeon: Elam Dutch, MD;  Location: [place];  Service: Vascular;  Laterality: Left;  . THROMBECTOMY    . THROMBECTOMY ILIAC ARTERY Left 11/24/2018   Procedure: THROMBECTOMY LEFT LIMB OF AORTOBIFEMORAL GRAFT;  Surgeon: Elam Dutch, MD;  Location: [place];  Service: Vascular;  Laterality: Left;  . TONSILLECTOMY      Current Outpatient Medications  Medication Sig Dispense Refill  . atorvastatin (LIPITOR) 10 MG tablet TAKE 1 TABLET BY MOUTH EVERY DAY (Patient taking differently: Take 10 mg by mouth daily. ) 90 tablet 1  . LORazepam (ATIVAN) 0.5 MG tablet Take  0.5 mg by mouth daily as needed for anxiety. Take 1/2 tablet by mouth as needed for anxiety    . metoprolol tartrate (LOPRESSOR) 25 MG tablet Take 25 mg by mouth 2 (two) times daily.     . rivaroxaban (XARELTO) 20 MG TABS tablet Take 1 tablet (20 mg total) by mouth daily with supper. (Patient taking differently: Take 20 mg by mouth daily. ) 30 tablet 6   No current facility-administered medications for this encounter.    Allergies  Allergen Reactions  . Morphine And Related Other (See Comments)    CONFUSED    Social History   Socioeconomic History  . Marital status: Widowed    Spouse name: Not on file  . Number of children: Not on file  . Years of education: Not on file  . Highest education level: Not  on file  Occupational History  . Not on file  Tobacco Use  . Smoking status: Former Smoker    Types: Cigarettes    Quit date: 04/16/1998    Years since quitting: 21.2  . Smokeless tobacco: Never Used  Substance and Sexual Activity  . Alcohol use: Yes    Comment: occasional  . Drug use: No  . Sexual activity: Not on file  Other Topics Concern  . Not on file  Social History Narrative  . Not on file   Social Determinants of Health   Financial Resource Strain:   . Difficulty of Paying Living Expenses:   Food Insecurity:   . Worried About Charity fundraiser in the Last Year:   . Arboriculturist in the Last Year:   Transportation Needs:   . Film/video editor (Medical):   Marland Kitchen Lack of Transportation (Non-Medical):   Physical Activity:   . Days of Exercise per Week:   . Minutes of Exercise per Session:   Stress:   . Feeling of Stress :   Social Connections:   . Frequency of Communication with Friends and Family:   . Frequency of Social Gatherings with Friends and Family:   . Attends Religious Services:   . Active Member of Clubs or Organizations:   . Attends Archivist Meetings:   Marland Kitchen Marital Status:   Intimate Partner Violence:   . Fear of Current or  Ex-Partner:   . Emotionally Abused:   Marland Kitchen Physically Abused:   . Sexually Abused:     Family History  Problem Relation Age of Onset  . Coronary artery disease Other   . Lung cancer Other     ROS- All systems are reviewed and negative except as per the HPI above  Physical Exam: There were no vitals filed for this visit. Wt Readings from Last 3 Encounters:  06/18/19 78 kg  06/09/19 77.4 kg  06/02/19 78.1 kg    Labs: Lab Results  Component Value Date   NA 140 06/09/2019   K 3.9 06/09/2019   CL 109 06/09/2019   CO2 22 06/09/2019   GLUCOSE 97 06/09/2019   BUN 27 (H) 06/09/2019   CREATININE 1.08 06/09/2019   CALCIUM 9.1 06/09/2019   Lab Results  Component Value Date   INR 1.2 11/24/2018   Lab Results  Component Value Date   CHOL 105 11/24/2018   HDL 47 11/24/2018   LDLCALC 49 11/24/2018   TRIG 44 11/24/2018     GEN- The patient is well appearing, alert and oriented x 3 today.   Head- normocephalic, atraumatic Eyes-  Sclera clear, conjunctiva pink Ears- hearing intact Oropharynx- clear Neck- supple, no JVP Lymph- no cervical lymphadenopathy Lungs- Clear to ausculation bilaterally, normal work of breathing Heart- irregular rate and rhythm, no murmurs, rubs or gallops, PMI not laterally displaced GI- soft, NT, ND, + BS Extremities- no clubbing, cyanosis, or edema MS- no significant deformity or atrophy Skin- no rash or lesion Psych- euthymic mood, full affect Neuro- strength and sensation are intact  EKG- afib at 88 bpm, qrs int 107 ms, qtc 476 ms Epic records revied Echo-IMPRESSIONS    1. The left ventricle is mildly dilated. The left ventricle has mild-moderately reduced systolic function, with an ejection fraction of 40-45%. Left ventricular diastolic Doppler parameters are consistent with indeterminate diastolic dysfunction. Basal  to mid inferior akinesis, basal inferoseptal hypokinesis.  2. There is mild mitral annular calcification present. No  evidence of mitral valve  stenosis. Mild mitral regurgitation.  3. The aortic valve is tricuspid Aortic valve regurgitation is trivial by color flow Doppler. No stenosis of the aortic valve.  4. The right ventricle has mildly reduced systolic function. The cavity was mildly enlarged. There is no increase in right ventricular wall thickness.  5. Left atrial size was mildly dilated.  6. Right atrial size was mildly dilated.  7. Limited echo.  FINDINGS  Left Ventricle: The left ventricle has mild-moderately reduced systolic function, with an ejection fraction of 40-45%. The cavity size was mildly dilated. There is no increase in left ventricular wall thickness. Left ventricular diastolic Doppler  parameters are consistent with indeterminate diastolic dysfunction. Definity contrast agent was given IV to delineate the left ventricular endocardial borders.  Assessment and Plan: 1. Paroxysmal afib Last fall, pt was started on amiodarone at 400 mg bid but failed to titrate down dose and stayed on this dose  x one month   He had extreme shakiness of hands which  resolved with stopping amiodarone  He did not want to start back amiodarone at lower dose  at the time  He appears to have been in SR until the past 3 weeks(? )  He had successful cardioversion but did go back into afib at some point afterward He feels that he does not feel any different today in rate controlled afib vrs SR right after the cardioversion He is clear he does not want to retry amio or discuss Tikosyn further and wants to continue in rate control  Continue to limit beer intake   2. CHA2DS2VASc score of at least 5 He is on xarelto 20 mg daily and reassures me he takes faithfully, no missed doses   F/u with afib clinic in one month   Butch Penny C. Aivan Fillingim, [facility] [address] [place], [place] [address] [phone]

## 2019-07-08 ENCOUNTER — Other Ambulatory Visit: Payer: Self-pay | Admitting: Vascular Surgery

## 2019-07-08 DIAGNOSIS — I739 Peripheral vascular disease, unspecified: Secondary | ICD-10-CM

## 2019-07-09 ENCOUNTER — Other Ambulatory Visit: Payer: Self-pay

## 2019-07-09 ENCOUNTER — Encounter: Payer: Self-pay | Admitting: Vascular Surgery

## 2019-07-09 ENCOUNTER — Ambulatory Visit (HOSPITAL_COMMUNITY)
Admission: RE | Admit: 2019-07-09 | Discharge: 2019-07-09 | Disposition: A | Discharge status: Home / Self Care | Payer: Medicare PPO | Source: Ambulatory Visit | Attending: Vascular Surgery | Admitting: Vascular Surgery

## 2019-07-09 ENCOUNTER — Ambulatory Visit: Payer: Medicare PPO | Admitting: Vascular Surgery

## 2019-07-09 VITALS — BP 106/74 | HR 86 | Temp 97.4°F | Resp 20 | Ht 67.0 in | Wt 170.0 lb

## 2019-07-09 DIAGNOSIS — I739 Peripheral vascular disease, unspecified: Secondary | ICD-10-CM | POA: Diagnosis not present

## 2019-07-09 NOTE — Progress Notes (Signed)
Patient is a 79 year old male who returns for follow-up today.  He was last seen November 2020.  He previously underwent aortobifemoral bypass graft and left femoropopliteal bypass by Dr. Kellie Simmering several years ago.  Most recently he underwent right femoral endarterectomy in 2018 followed by left femoral endarterectomy in 2020 after the left limb of the graft occluded.  He was noted at that time to have chronic occlusion of the left femoropopliteal bypass.   He currently develops claudication symptoms after walking about 600 feet.  He does not feel particularly disabled by this.  He is not interested in any other interventions currently.  He recently had an attempted cardioversion in the hospital that was unsuccessful.  He is on Xarelto for atrial fibrillation.    Medical problems include coronary artery disease elevated cholesterol hypertension all of which are currently stable.  Past Medical History:  Diagnosis Date  . Basal cell carcinoma   . Benign localized prostatic hyperplasia with lower urinary tract symptoms (LUTS)   . CAD (coronary artery disease)    S/P CABG in 2001 with LIMA to the LAD, SVG to RI, SVG to left circumflex and SVG to PDA.  Marland Kitchen DCM (dilated cardiomyopathy) ([place]) 12/21/2018   Ischemic with EF 40-45% by Echo 11/2018  . Dizzy spells   . Erectile dysfunction   . Grief reaction   . Hypercholesterolemia   . Hypertension   . Myocardial infarction ([place])   . Peripheral vascular disease (HCC)    S/P right CFA endarterectomy and profundoplasty by Dr. Oneida Alar in 2018 with a prior history of aortobifem with left fem-BK popliteal graft.  Marland Kitchen PFO (patent foramen ovale)    on 12/06/16 echo [facility])  . Syncope and collapse    Current Outpatient Medications on File Prior to Visit  Medication Sig Dispense Refill  . atorvastatin (LIPITOR) 10 MG tablet TAKE 1 TABLET BY MOUTH EVERY DAY (Patient taking differently: Take 10 mg by mouth daily. ) 90 tablet 1  . escitalopram  (LEXAPRO) 5 MG tablet     . metoprolol tartrate (LOPRESSOR) 25 MG tablet Take 25 mg by mouth 2 (two) times daily.     . rivaroxaban (XARELTO) 20 MG TABS tablet Take 1 tablet (20 mg total) by mouth daily. 30 tablet 2  . LORazepam (ATIVAN) 0.5 MG tablet Take 0.5 mg by mouth daily as needed for anxiety. Take 1/2 tablet by mouth as needed for anxiety     No current facility-administered medications on file prior to visit.     Review of systems:  Data: Patient had bilateral ABIs performed today.  Right side was 0.66 left side was 0.68.  This is essentially unchanged from November 2020.  Assessment: Patent aortobifemoral bypass, mild claudication symptoms with known chronic SFA occlusions.  Overall unchanged from about 4 months ago.  Plan: Patient will follow up in our APP clinic in 1 year.  He will have bilateral ABIs at that office visit.  He will call sooner if he has problems.  Ruta Hinds, MD [facility] Office: [phone]

## 2019-07-13 ENCOUNTER — Other Ambulatory Visit: Payer: Self-pay | Admitting: *Deleted

## 2019-07-13 DIAGNOSIS — I739 Peripheral vascular disease, unspecified: Secondary | ICD-10-CM

## 2019-07-15 ENCOUNTER — Telehealth: Payer: Self-pay

## 2019-07-15 NOTE — Telephone Encounter (Signed)
Patient's daughter called concerned because Mr. Demore is out of his Xarelto samples and his insurance does not pay enough.  His part will bee $800 per mth.  I advised that she try [facility] [employer] patient assistance program but pt makes $10 a mth too much.   I gave Freda Munro a list of other medications to ask her insurance about.  She will contact me if they will pay and I will forward the message to Dr. Oneida Alar.  Thurston Hole., LPN

## 2019-07-22 ENCOUNTER — Other Ambulatory Visit: Payer: Self-pay

## 2019-07-22 DIAGNOSIS — I739 Peripheral vascular disease, unspecified: Secondary | ICD-10-CM

## 2019-07-22 MED ORDER — RIVAROXABAN 20 MG PO TABS: 20.0000 mg | ORAL_TABLET | Freq: Every day | ORAL | 3 refills | Status: AC

## 2019-07-28 ENCOUNTER — Ambulatory Visit (HOSPITAL_COMMUNITY): Payer: Medicare PPO | Admitting: Nurse Practitioner

## 2019-07-28 ENCOUNTER — Encounter (HOSPITAL_COMMUNITY): Payer: Self-pay

## 2019-08-05 DIAGNOSIS — J189 Pneumonia, unspecified organism: Secondary | ICD-10-CM | POA: Diagnosis not present

## 2019-08-05 DIAGNOSIS — Z1152 Encounter for screening for COVID-19: Secondary | ICD-10-CM | POA: Diagnosis not present

## 2019-08-05 DIAGNOSIS — R05 Cough: Secondary | ICD-10-CM | POA: Diagnosis not present

## 2019-08-06 DIAGNOSIS — J189 Pneumonia, unspecified organism: Secondary | ICD-10-CM | POA: Diagnosis not present

## 2019-09-01 DIAGNOSIS — J189 Pneumonia, unspecified organism: Secondary | ICD-10-CM | POA: Diagnosis not present

## 2019-09-07 DIAGNOSIS — F410 Panic disorder [episodic paroxysmal anxiety] without agoraphobia: Secondary | ICD-10-CM | POA: Diagnosis not present

## 2019-09-07 DIAGNOSIS — I739 Peripheral vascular disease, unspecified: Secondary | ICD-10-CM | POA: Diagnosis not present

## 2019-09-07 DIAGNOSIS — I25810 Atherosclerosis of coronary artery bypass graft(s) without angina pectoris: Secondary | ICD-10-CM | POA: Diagnosis not present

## 2019-09-07 DIAGNOSIS — F418 Other specified anxiety disorders: Secondary | ICD-10-CM | POA: Diagnosis not present

## 2019-09-07 DIAGNOSIS — F1010 Alcohol abuse, uncomplicated: Secondary | ICD-10-CM | POA: Diagnosis not present

## 2019-09-07 DIAGNOSIS — R05 Cough: Secondary | ICD-10-CM | POA: Diagnosis not present

## 2019-09-07 DIAGNOSIS — I4891 Unspecified atrial fibrillation: Secondary | ICD-10-CM | POA: Diagnosis not present

## 2019-09-07 DIAGNOSIS — I255 Ischemic cardiomyopathy: Secondary | ICD-10-CM | POA: Diagnosis not present

## 2019-09-09 ENCOUNTER — Other Ambulatory Visit (HOSPITAL_COMMUNITY): Payer: Self-pay | Admitting: Respiratory Therapy

## 2019-09-09 DIAGNOSIS — I25719 Atherosclerosis of autologous vein coronary artery bypass graft(s) with unspecified angina pectoris: Secondary | ICD-10-CM

## 2019-09-09 DIAGNOSIS — R0609 Other forms of dyspnea: Secondary | ICD-10-CM

## 2019-09-09 DIAGNOSIS — I255 Ischemic cardiomyopathy: Secondary | ICD-10-CM

## 2019-09-09 DIAGNOSIS — R059 Cough, unspecified: Secondary | ICD-10-CM

## 2019-09-09 DIAGNOSIS — R5383 Other fatigue: Secondary | ICD-10-CM

## 2019-09-22 ENCOUNTER — Other Ambulatory Visit (HOSPITAL_COMMUNITY)
Admission: RE | Admit: 2019-09-22 | Discharge: 2019-09-22 | Disposition: A | Discharge status: Home / Self Care | Payer: Medicare HMO | Source: Ambulatory Visit | Attending: Internal Medicine | Admitting: Internal Medicine

## 2019-09-22 DIAGNOSIS — Z01812 Encounter for preprocedural laboratory examination: Secondary | ICD-10-CM | POA: Insufficient documentation

## 2019-09-22 DIAGNOSIS — Z20822 Contact with and (suspected) exposure to covid-19: Secondary | ICD-10-CM | POA: Diagnosis not present

## 2019-09-22 LAB — SARS CORONAVIRUS 2 (TAT 6-24 HRS): SARS Coronavirus 2: NEGATIVE

## 2019-09-23 ENCOUNTER — Ambulatory Visit (HOSPITAL_COMMUNITY)
Admission: RE | Admit: 2019-09-23 | Discharge: 2019-09-23 | Disposition: A | Discharge status: Home / Self Care | Payer: Medicare HMO | Source: Ambulatory Visit | Attending: Internal Medicine | Admitting: Internal Medicine

## 2019-09-23 ENCOUNTER — Other Ambulatory Visit: Payer: Self-pay

## 2019-09-23 DIAGNOSIS — R0600 Dyspnea, unspecified: Secondary | ICD-10-CM | POA: Insufficient documentation

## 2019-09-23 DIAGNOSIS — R0609 Other forms of dyspnea: Secondary | ICD-10-CM

## 2019-09-23 DIAGNOSIS — I255 Ischemic cardiomyopathy: Secondary | ICD-10-CM

## 2019-09-23 DIAGNOSIS — R059 Cough, unspecified: Secondary | ICD-10-CM

## 2019-09-23 DIAGNOSIS — I25719 Atherosclerosis of autologous vein coronary artery bypass graft(s) with unspecified angina pectoris: Secondary | ICD-10-CM

## 2019-09-23 DIAGNOSIS — R5383 Other fatigue: Secondary | ICD-10-CM | POA: Diagnosis not present

## 2019-09-23 DIAGNOSIS — R05 Cough: Secondary | ICD-10-CM | POA: Diagnosis not present

## 2019-09-23 LAB — PULMONARY FUNCTION TEST
DL/VA % pred: 49 %
DL/VA: 1.96 ml/min/mmHg/L
DLCO unc % pred: 50 %
DLCO unc: 10.82 ml/min/mmHg
FEF 25-75 Post: 1.56 L/sec
FEF 25-75 Pre: 1.59 L/sec
FEF2575-%Change-Post: -2 %
FEF2575-%Pred-Post: 93 %
FEF2575-%Pred-Pre: 95 %
FEV1-%Change-Post: 0 %
FEV1-%Pred-Post: 102 %
FEV1-%Pred-Pre: 102 %
FEV1-Post: 2.47 L
FEV1-Pre: 2.48 L
FEV1FVC-%Change-Post: 6 %
FEV1FVC-%Pred-Pre: 98 %
FEV6-%Change-Post: -4 %
FEV6-%Pred-Post: 103 %
FEV6-%Pred-Pre: 108 %
FEV6-Post: 3.26 L
FEV6-Pre: 3.42 L
FEV6FVC-%Change-Post: 1 %
FEV6FVC-%Pred-Post: 107 %
FEV6FVC-%Pred-Pre: 105 %
FVC-%Change-Post: -6 %
FVC-%Pred-Post: 95 %
FVC-%Pred-Pre: 102 %
FVC-Post: 3.28 L
FVC-Pre: 3.5 L
Post FEV1/FVC ratio: 75 %
Post FEV6/FVC ratio: 100 %
Pre FEV1/FVC ratio: 71 %
Pre FEV6/FVC Ratio: 98 %
RV % pred: 115 %
RV: 2.79 L
TLC % pred: 101 %
TLC: 6.33 L

## 2019-09-23 MED ORDER — ALBUTEROL SULFATE (2.5 MG/3ML) 0.083% IN NEBU
2.5000 mg | INHALATION_SOLUTION | Freq: Once | RESPIRATORY_TRACT | Status: AC
  Administered 2019-09-23: 2.5 mg via RESPIRATORY_TRACT

## 2019-10-07 ENCOUNTER — Other Ambulatory Visit: Payer: Self-pay | Admitting: Vascular Surgery

## 2019-10-14 DIAGNOSIS — K5900 Constipation, unspecified: Secondary | ICD-10-CM | POA: Diagnosis not present

## 2019-10-14 DIAGNOSIS — I4581 Long QT syndrome: Secondary | ICD-10-CM | POA: Diagnosis not present

## 2019-10-14 DIAGNOSIS — I255 Ischemic cardiomyopathy: Secondary | ICD-10-CM | POA: Diagnosis not present

## 2019-10-14 DIAGNOSIS — F410 Panic disorder [episodic paroxysmal anxiety] without agoraphobia: Secondary | ICD-10-CM | POA: Diagnosis not present

## 2019-10-14 DIAGNOSIS — I739 Peripheral vascular disease, unspecified: Secondary | ICD-10-CM | POA: Diagnosis not present

## 2019-10-14 DIAGNOSIS — R634 Abnormal weight loss: Secondary | ICD-10-CM | POA: Diagnosis not present

## 2019-10-14 DIAGNOSIS — R0609 Other forms of dyspnea: Secondary | ICD-10-CM | POA: Diagnosis not present

## 2019-10-14 DIAGNOSIS — I25810 Atherosclerosis of coronary artery bypass graft(s) without angina pectoris: Secondary | ICD-10-CM | POA: Diagnosis not present

## 2019-10-14 DIAGNOSIS — E46 Unspecified protein-calorie malnutrition: Secondary | ICD-10-CM | POA: Diagnosis not present

## 2019-10-20 ENCOUNTER — Emergency Department (HOSPITAL_COMMUNITY)
Admission: EM | Admit: 2019-10-20 | Discharge: 2019-11-15 | Disposition: E | Payer: Medicare HMO | Attending: Emergency Medicine | Admitting: Emergency Medicine

## 2019-10-20 ENCOUNTER — Emergency Department (HOSPITAL_COMMUNITY): Payer: Medicare HMO

## 2019-10-20 DIAGNOSIS — I499 Cardiac arrhythmia, unspecified: Secondary | ICD-10-CM | POA: Diagnosis not present

## 2019-10-20 DIAGNOSIS — I213 ST elevation (STEMI) myocardial infarction of unspecified site: Secondary | ICD-10-CM | POA: Insufficient documentation

## 2019-10-20 DIAGNOSIS — R9431 Abnormal electrocardiogram [ECG] [EKG]: Secondary | ICD-10-CM | POA: Diagnosis not present

## 2019-10-20 DIAGNOSIS — I4891 Unspecified atrial fibrillation: Secondary | ICD-10-CM | POA: Insufficient documentation

## 2019-10-20 DIAGNOSIS — I469 Cardiac arrest, cause unspecified: Secondary | ICD-10-CM | POA: Diagnosis not present

## 2019-10-20 DIAGNOSIS — R0602 Shortness of breath: Secondary | ICD-10-CM | POA: Diagnosis not present

## 2019-10-20 LAB — I-STAT CHEM 8, ED
BUN: 37 mg/dL — ABNORMAL HIGH (ref 8–23)
Calcium, Ion: 1.06 mmol/L — ABNORMAL LOW (ref 1.15–1.40)
Chloride: 109 mmol/L (ref 98–111)
Creatinine, Ser: 1.4 mg/dL — ABNORMAL HIGH (ref 0.61–1.24)
Glucose, Bld: 109 mg/dL — ABNORMAL HIGH (ref 70–99)
HCT: 37 % — ABNORMAL LOW (ref 39.0–52.0)
Hemoglobin: 12.6 g/dL — ABNORMAL LOW (ref 13.0–17.0)
Potassium: 4.9 mmol/L (ref 3.5–5.1)
Sodium: 141 mmol/L (ref 135–145)
TCO2: 15 mmol/L — ABNORMAL LOW (ref 22–32)

## 2019-10-20 MED ORDER — AMIODARONE HCL 150 MG/3ML IV SOLN
INTRAVENOUS | Status: AC | PRN
  Administered 2019-10-20: 300 mg via INTRAVENOUS

## 2019-10-20 MED ORDER — EPINEPHRINE 1 MG/10ML IJ SOSY
PREFILLED_SYRINGE | INTRAMUSCULAR | Status: AC | PRN
  Administered 2019-10-20: 1 via INTRAVENOUS

## 2019-10-20 MED ORDER — ROCURONIUM BROMIDE 50 MG/5ML IV SOLN
70.0000 mg | Freq: Once | INTRAVENOUS | Status: AC
  Administered 2019-10-20: 70 mg via INTRAVENOUS

## 2019-10-20 MED ORDER — SODIUM CHLORIDE 0.9 % IV SOLN
INTRAVENOUS | Status: AC | PRN
  Administered 2019-10-20: 1000 mL via INTRAVENOUS

## 2019-10-20 MED ORDER — SODIUM BICARBONATE 8.4 % IV SOLN
INTRAVENOUS | Status: AC | PRN
  Administered 2019-10-20 (×2): 50 meq via INTRAVENOUS

## 2019-10-20 MED ORDER — EPINEPHRINE 1 MG/10ML IJ SOSY
PREFILLED_SYRINGE | INTRAMUSCULAR | Status: AC | PRN
  Administered 2019-10-20 (×11): 1 via INTRAVENOUS

## 2019-10-20 MED ORDER — EPINEPHRINE HCL 5 MG/250ML IV SOLN IN NS
INTRAVENOUS | Status: AC
  Filled 2019-10-20: qty 250

## 2019-10-20 MED ORDER — CALCIUM CHLORIDE 10 % IV SOLN
INTRAVENOUS | Status: AC | PRN
  Administered 2019-10-20: 1 g via INTRAVENOUS

## 2019-10-20 MED ORDER — ETOMIDATE 2 MG/ML IV SOLN
20.0000 mg | Freq: Once | INTRAVENOUS | Status: AC
  Administered 2019-10-20: 20 mg via INTRAVENOUS

## 2019-10-20 MED ORDER — MAGNESIUM SULFATE 50 % IJ SOLN
INTRAMUSCULAR | Status: AC | PRN
  Administered 2019-10-20: 2 g via INTRAVENOUS

## 2019-10-20 NOTE — ED Triage Notes (Signed)
Patient from home with shortness of breath, getting worse today.  Patient is in Afib RVR, no chest pain at this time.  Patient was given 20mg  total of Cardizem, BP dropped to 80/60 and was given 550ml fluid.  Patient continues in Afib RVR rate of 130's.  Patient is CAOx4 at this time.  Rhonchi in lower lobes bilat for BS.

## 2019-10-20 NOTE — ED Provider Notes (Signed)
[facility]MENT Provider Note   CSN: [id number] Arrival date & time: 10/26/2019  2226     History Chief Complaint  Patient presents with  . Code STEMI  . Atrial Fibrillation    [facility] is a 79 y.o. male.  HPI Patient brought by EMS from home for increasing shortness of breath.  Symptoms have been worsening over the course of the day.  Patient has history of paroxysmal atrial fibrillation.  Patient does take Xarelto and is compliant with medication.  On arrival EMS reports elevated heart rate and atrial fibrillation.  They did administer Cardizem total of 20 mg and 10 mg increments.  Patient did have a drop in blood pressure to 80s over 60s.  500 mL of fluid administered.  Patient continued to have rapid ventricular response in the 130s.  EMS reports he was tolerating supplemental oxygen and facemask poorly.  Patient was feeling claustrophobic and trying to remove it.  Patient denied any chest pain.  Does endorse feeling  short of breath.    Past Medical History:  Diagnosis Date  . Basal cell carcinoma   . Benign localized prostatic hyperplasia with lower urinary tract symptoms (LUTS)   . CAD (coronary artery disease)    S/P CABG in 2001 with LIMA to the LAD, SVG to RI, SVG to left circumflex and SVG to PDA.  Marland Kitchen DCM (dilated cardiomyopathy) ([place]) 12/21/2018   Ischemic with EF 40-45% by Echo 11/2018  . Dizzy spells   . Erectile dysfunction   . Grief reaction   . Hypercholesterolemia   . Hypertension   . Myocardial infarction ([place])   . Peripheral vascular disease (HCC)    S/P right CFA endarterectomy and profundoplasty by Dr. Oneida Alar in 2018 with a prior history of aortobifem with left fem-BK popliteal graft.  Marland Kitchen PFO (patent foramen ovale)    on 12/06/16 echo [facility])  . Syncope and collapse     Patient Active Problem List   Diagnosis Date Noted  . DCM (dilated cardiomyopathy) ([place]) 12/21/2018  . PAF (paroxysmal atrial  fibrillation) ([place])   . Aortic occlusion ([place]) 11/20/2018  . Coronary artery disease due to lipid rich plaque   . Pure hypercholesterolemia   . Benign essential HTN 06/22/2018  . Hyperlipidemia LDL goal <70 06/22/2018  . Dizziness 06/22/2018  . PFO (patent foramen ovale) 06/22/2018  . PAD (peripheral artery disease) ([place]) 12/31/2016  . Claudication ([place]) 12/07/2016    Past Surgical History:  Procedure Laterality Date  . ABDOMINAL AORTIC ANEURYSM REPAIR    . CARDIOVERSION N/A 06/18/2019   Procedure: CARDIOVERSION;  Surgeon: Fay Records, MD;  Location[facility];  Service: Cardiovascular;  Laterality: N/A;  . CORONARY ARTERY BYPASS GRAFT  2001    Median sternotomy for coronary artery bypass grafting x 4 (left internal mammary artery to distal left anterior descending coronary artery, saphenous vein graft to ramus intermediate branch, sequential saphenous vein graft to second circumflex marginal branch, saphenous vein graft to posterior descending coronary artery).  . DUPUYTREN CONTRACTURE RELEASE Left 2012  . ENDARTERECTOMY FEMORAL Right 12/31/2016   Procedure: RIGHT FEMORAL ENDARTERECTOMY;  Surgeon: Elam Dutch, MD;  Location: [place];  Service: Vascular;  Laterality: Right;  . ENDARTERECTOMY FEMORAL Left 11/24/2018   Procedure: ENDARTERECTOMY FEMORAL WITH PROFUNDOPLASTY;  Surgeon: Elam Dutch, MD;  Location: [place];  Service: Vascular;  Laterality: Left;  . KNEE ARTHROSCOPY Right 2001  . LOWER EXTREMITY ANGIOGRAPHY N/A 12/07/2016   Procedure: Lower Extremity  Angiography;  Surgeon: Adrian Prows, MD;  Location: [place] CV LAB;  Service: Cardiovascular;  Laterality: N/A;  . MULTIPLE TOOTH EXTRACTIONS    . NASAL SEPTUM SURGERY    . PATCH ANGIOPLASTY Right 12/31/2016   Procedure: PATCH PROFUNDOPLASTY, Right Femoral Profunda using Hemashield vascular patch.;  Surgeon: Elam Dutch, MD;  Location: [facility] OR;  Service: Vascular;  Laterality: Right;  . PATCH ANGIOPLASTY Left  11/24/2018   Procedure: Patch Angioplasty Left Common Femoral Artery;  Surgeon: Elam Dutch, MD;  Location: [place];  Service: Vascular;  Laterality: Left;  . THROMBECTOMY    . THROMBECTOMY ILIAC ARTERY Left 11/24/2018   Procedure: THROMBECTOMY LEFT LIMB OF AORTOBIFEMORAL GRAFT;  Surgeon: Elam Dutch, MD;  Location: [place];  Service: Vascular;  Laterality: Left;  . TONSILLECTOMY         Family History  Problem Relation Age of Onset  . Coronary artery disease Other   . Lung cancer Other     Social History   Tobacco Use  . Smoking status: Former Smoker    Types: Cigarettes    Quit date: 04/16/1998    Years since quitting: 21.5  . Smokeless tobacco: Never Used  Vaping Use  . Vaping Use: Never used  Substance Use Topics  . Alcohol use: Yes    Comment: occasional  . Drug use: No    Home Medications Prior to Admission medications   Medication Sig Start Date End Date Taking? Authorizing Provider  atorvastatin (LIPITOR) 10 MG tablet TAKE 1 TABLET BY MOUTH EVERY DAY 10/07/19   [name], MD  escitalopram (LEXAPRO) 5 MG tablet  06/25/19   [provider]  LORazepam (ATIVAN) 0.5 MG tablet Take 0.5 mg by mouth daily as needed for anxiety. Take 1/2 tablet by mouth as needed for anxiety 06/03/19   [provider]  metoprolol tartrate (LOPRESSOR) 25 MG tablet Take 25 mg by mouth 2 (two) times daily.     [provider]  rivaroxaban (XARELTO) 20 MG TABS tablet Take 1 tablet (20 mg total) by mouth daily. 07/22/19   Elam Dutch, MD    Allergies    Morphine and related  Review of Systems   Review of Systems Level 5 caveat cannot obtain review of systems due to patient distress. Physical Exam Updated Vital Signs BP 104/88 (BP Location: Right Arm)   Pulse (!) 120   Resp (!) 25   Physical Exam Constitutional:      Comments: Patient is awake.  Increased work of breathing.  Answering questions in short responses.    HENT:     Head:  Normocephalic and atraumatic.     Mouth/Throat:     Pharynx: Oropharynx is clear.  Eyes:     Extraocular Movements: Extraocular movements intact.  Cardiovascular:     Comments: Tachycardia irregularly irregular. Pulmonary:     Comments: Moderate to severe increased work of breathing.  Soft breath sounds at bases with crackles. Abdominal:     General: There is no distension.     Palpations: Abdomen is soft.     Tenderness: There is no abdominal tenderness. There is no guarding.  Musculoskeletal:        General: No swelling. Normal range of motion.     Right lower leg: No edema.     Left lower leg: No edema.  Skin:    Comments: Patient is pale in appearance.  Neurological:     Comments: Patient is awake and moving extremities purposefully.  No focal deficit.     ED Results / Procedures / Treatments   Labs (all labs ordered are listed, but only abnormal results are displayed) Labs Reviewed  I-STAT CHEM 8, ED - Abnormal; Notable for the following components:      Result Value   BUN 37 (*)    Creatinine, Ser 1.40 (*)    Glucose, Bld 109 (*)    Calcium, Ion 1.06 (*)    TCO2 15 (*)    Hemoglobin 12.6 (*)    HCT 37.0 (*)    All other components within normal limits    EKG EKG Interpretation  Date/Time:  Tuesday October 20 2019 22:41:19 EDT Ventricular Rate:  147 PR Interval:    QRS Duration: 178 QT Interval:  420 QTC Calculation: 591 R Axis:   -77 Text Interpretation: Extreme tachycardia with wide complex, no further rhythm analysis attempted IVCD, PVC, Motion artifact Confirmed by Charlesetta Shanks [phone]) on 2019/11/04 12:04:10 AM   Radiology No results found.  Procedures Procedure Name: Intubation Date/Time: 11/04/19 12:08 AM Performed by: Charlesetta Shanks, MD Pre-anesthesia Checklist: Patient being monitored, Emergency Drugs available and Suction available Oxygen Delivery Method: Ambu bag Induction Type: Rapid sequence Ventilation: Mask ventilation without  difficulty and Two handed mask ventilation required Laryngoscope Size: Glidescope and 3 Tube size: 7.5 mm Number of attempts: 1 Placement Confirmation: ETT inserted through vocal cords under direct vision,  CO2 detector and Breath sounds checked- equal and bilateral Secured at: 22 cm Tube secured with: ETT holder Comments: Intubated without difficulty under direct visualization.      (including critical care time) CRITICAL CARE Performed by: Charlesetta Shanks   Total critical care time: 30 minutes  Critical care time was exclusive of separately billable procedures and treating other patients.  Critical care was necessary to treat or prevent imminent or life-threatening deterioration.  Critical care was time spent personally by me on the following activities: development of treatment plan with patient and/or surrogate as well as nursing, discussions with consultants, evaluation of patient's response to treatment, examination of patient, obtaining history from patient or surrogate, ordering and performing treatments and interventions, ordering and review of laboratory studies, ordering and review of radiographic studies, pulse oximetry and re-evaluation of patient's condition.   Medications Ordered in ED Medications  EPINEPHrine NaCl 4-0.9 MG/250ML-% premix infusion (has no administration in time range)  EPINEPHrine (ADRENALIN) 1 MG/10ML injection (1 Syringe Intravenous Given 10/23/2019 2234)  etomidate (AMIDATE) injection 20 mg (20 mg Intravenous Given 10/26/2019 2238)  rocuronium (ZEMURON) injection 70 mg (70 mg Intravenous Given 10/27/2019 2239)  EPINEPHrine (ADRENALIN) 1 MG/10ML injection (1 Syringe Intravenous Given 11/02/2019 2306)  0.9 %  sodium chloride infusion (1,000 mLs Intravenous New Bag/Given 11/14/2019 2241)  calcium chloride injection (1 g Intravenous Given 11/14/2019 2249)  magnesium sulfate (IV Push/IM) injection (2 g Intravenous Given 10/29/2019 2251)  sodium bicarbonate injection (50 mEq  Intravenous Given 11/12/2019 2304)  amiodarone (CORDARONE) injection (300 mg Intravenous Given 11/03/2019 2253)    ED Course  I have reviewed the triage vital signs and the nursing notes.  Pertinent labs & imaging results that were available during my care of the patient were reviewed by me and considered in my medical decision making (see chart for details).    MDM Rules/Calculators/A&P                          Patient presented with severe shortness of breath and decompensated atrial fibrillation.  The rhythm  strip transmitted by EMS appeared consistent with inferior ST elevation with rapid atrial fibrillation.  On arrival, patient was anxious and dying to remove nonrebreather mask.  He was being transitioned to stretcher with anticipated intubation and cardioversion.  Within minutes he rested and went to PEA rhythm.  At that time resuscitation initiated with the patient and chest compressions as well as a fibrillation.  For 30 minutes of attempted resuscitation and repeat events of PEA, I did with the family members and patient's daughter advised that they did not wish further resuscitation or life support measures.  At that time I returned and code was discontinued. Final Clinical Impression(s) / ED Diagnoses Final diagnoses:  Atrial fibrillation with RVR (HCC)  Acute ST elevation myocardial infarction (STEMI), unspecified artery ([place])  Cardiac arrest [facility])    Rx / DC Orders ED Discharge Orders    None       Charlesetta Shanks, MD 06-Nov-2019 0010

## 2019-10-21 MED FILL — Medication: Qty: 1 | Status: AC

## 2019-10-26 ENCOUNTER — Institutional Professional Consult (permissible substitution): Payer: Medicare HMO | Admitting: Internal Medicine

## 2019-11-15 NOTE — Code Documentation (Signed)
Patient obtained ROSC

## 2019-11-15 NOTE — ED Notes (Addendum)
Patient lost pulses a third time, family did not want anymore resuscitation, daughter stated that the patient did not want any advanced life support.  Time of death called by Dr Johnney Killian at this time.

## 2019-11-15 DEATH — deceased

## 2021-03-17 IMAGING — CT CT ANGIOGRAPHY AOBIFEM WITHOUT AND WITH CONTRAST
1 of 9 series · 6 of 16 positions shown, 8 images · IV contrast (OMNI 350)
Comparison: None.

CLINICAL DATA: Decreased pulses in the left leg, history of
aortobifemoral bypass graft

EXAM:
CT ANGIOGRAPHY OF ABDOMINAL AORTA WITH ILIOFEMORAL RUNOFF
TECHNIQUE: Multidetector CT imaging of the abdomen, pelvis and lower
extremities was performed using the standard protocol during bolus
administration of intravenous contrast. Multiplanar CT image
reconstructions and MIPs were obtained to evaluate the vascular
anatomy.
CONTRAST:  100mL OMNIPAQUE IOHEXOL 350 MG/ML SOLN

[Series 6: cta runoff thins · axial · 0.59mm/px · z∈[+238,+1246]mm · 6 of 2824 slices shown, 8 images]
[im 404/2824  soft-tissue]
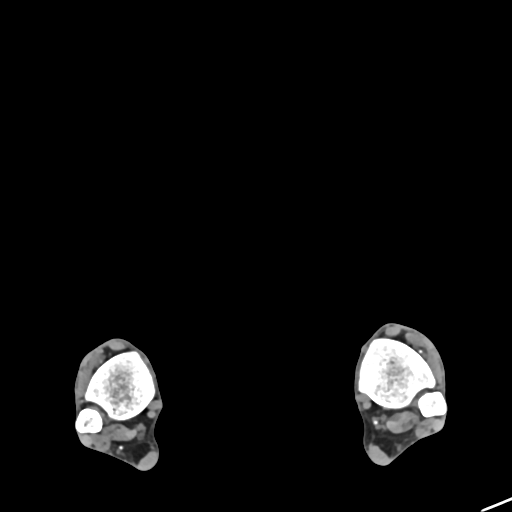
[im 404/2824  bone]
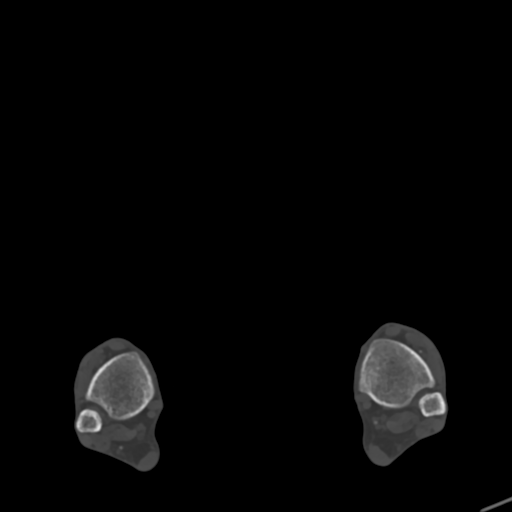
[im 807/2824  soft-tissue]
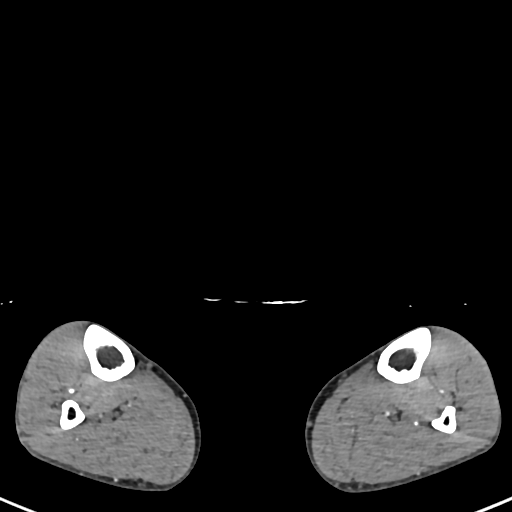
[im 1210/2824  soft-tissue]
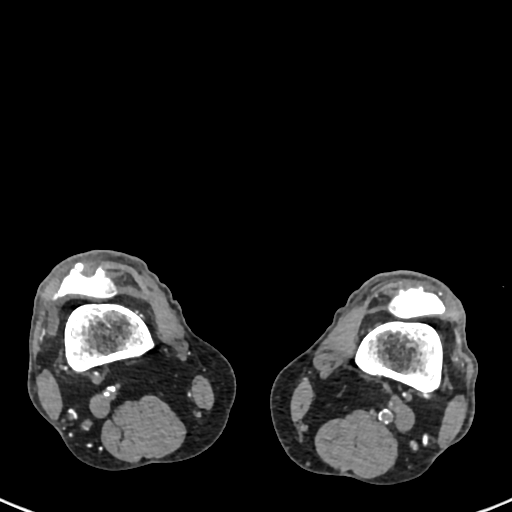
[im 1614/2824  soft-tissue]
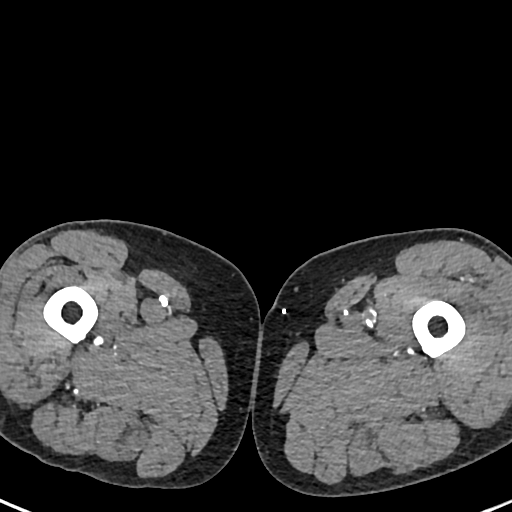
[im 2017/2824  soft-tissue]
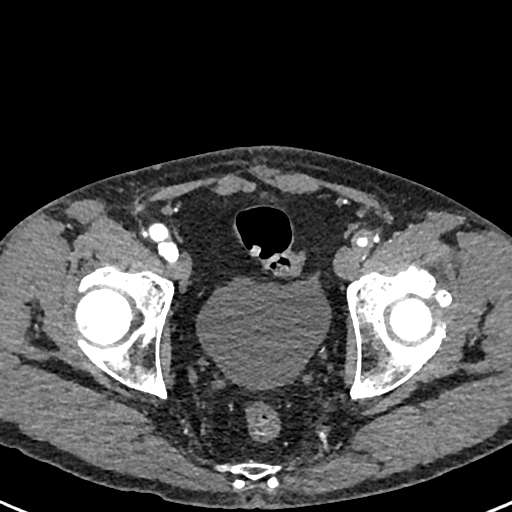
[im 2017/2824  bone]
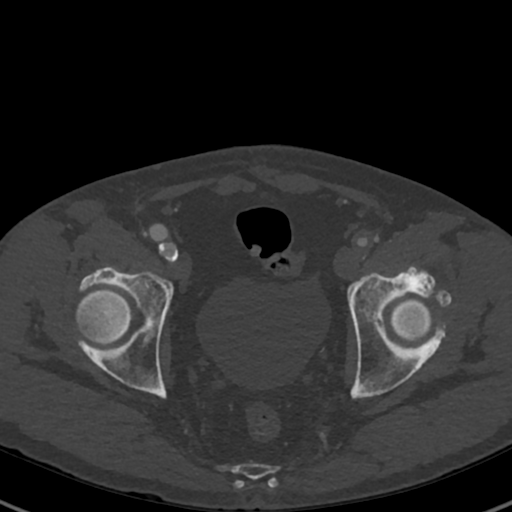
[im 2420/2824  soft-tissue]
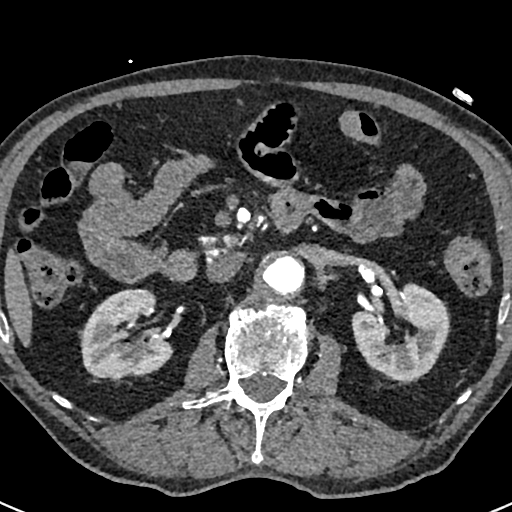

[6 of 16 positions shown; findings below may reference images not displayed]

FINDINGS: VASCULAR

Aorta: The abdominal aorta demonstrates diffuse atherosclerotic
calcification. Mild mural thrombus is noted within. No aneurysmal
dilatation is seen. Changes of aortobifemoral bypass graft are
noted. The flow in the left limb of the graft is decreased compared
to the right. Flow is noted to the level of the distal anastomosis.
There is some flow into the deep circumflex iliac artery on the
left. The right limb is widely patent. The distal anastomosis is
widely patent with runoff predominately via the profundus femoral
artery on the right.

Celiac: Mild atherosclerotic changes are noted. The origin is
otherwise within normal limits.

SMA: Atherosclerotic calcifications are seen with mild narrowing.

Renals: Single renal arteries are identified bilaterally.
Atherosclerotic calcifications are noted at the origin of the right
renal artery although no focal conclusion is seen.

IMA: Occluded. Reconstitution of the branches via collaterals from
the superior mesenteric artery are noted.

RIGHT Lower Extremity

Inflow: As described above the predominant in flow is via the right
limb of the aortobifemoral bypass graft. The distal anastomosis is
widely patent with runoff via the profundus femoral artery. The
native superficial femoral artery is occluded at its origin.
Retrograde flow in the native external iliac artery is noted with
opacification of the right internal iliac artery. There is
aneurysmal dilatation at the level of the iliac bifurcation on the
right measuring 2.6 cm. This is predominately mural thrombus.

Runoff: Heavy calcifications of the superficial femoral artery are
noted. Reconstitution of the infrapopliteal vessels is noted. There
are diminutive in size with predominant runoff via the peroneal
artery. Collateral flow to the distal posterior tibial and dorsalis
pedis arteries is seen.

LEFT Lower Extremity

Inflow: Inflow on the left is via the left limb of the
aortobifemoral bypass graft. The graft size is diminutive with some
mural thrombus identified. The distal anastomosis shows flow
minimally into the distal aspect of the left external iliac artery
although no collateral flow to the left internal iliac artery is
noted. Additionally opacification of the deep circumflex iliac
artery is noted on the left. The remainder of the distal anastomotic
site is occluded.

Runoff: Some collateral flow to the profundus femoral branches is
noted. The superficial femoral artery and popliteal artery are
occluded. Minimal opacification of the infrapopliteal vessels is
seen. There are patent proximally with predominant runoff via the
posterior tibial artery. Scattered atherosclerotic changes are
noted. Segmental areas of reconstitution in the anterior tibial
artery are seen.

Veins: No specific venous abnormality is noted.

Review of the MIP images confirms the above findings.

NON-VASCULAR

Lower chest: Mild emphysematous changes are noted. Fibrotic changes
are noted particularly in the left lower lobe. No sizable effusion
is noted.

Hepatobiliary: Liver is within normal limits. The gallbladder is
decompressed.

Pancreas: Unremarkable. No pancreatic ductal dilatation or
surrounding inflammatory changes.

Spleen: Normal in size without focal abnormality.

Adrenals/Urinary Tract: Adrenal glands are within normal limits.
Kidneys demonstrate a normal enhancement pattern bilaterally. No
renal calculi or obstructive changes are seen. The bladder is well
distended.

Stomach/Bowel: Stomach is within normal limits. Appendix appears
normal. No evidence of bowel wall thickening, distention, or
inflammatory changes.

Lymphatic: No sizable lymphadenopathy is noted.

Reproductive: Prostate is unremarkable.

Other: No abdominal wall hernia or abnormality. No abdominopelvic
ascites.

Musculoskeletal: Degenerative changes of the lumbar spine are seen.
No acute bony abnormality is noted.
IMPRESSION: VASCULAR

Changes consistent with aortobifemoral bypass graft. The left leg is
diminutive in size due to distal occlusion of the anastomotic site
with only minimal flow into the distal aspect of the left external
iliac artery as well as the deep circumflex iliac artery. The right
limb is widely patent as described above.

Occlusion of the right superficial femoral artery and popliteal
artery. There is minimal reconstitution of the infrapopliteal
vessels with predominant flow distally via the peroneal artery.

Occlusion of the superficial femoral artery on the left. Minimal
reconstitution of the profundus femoral artery branches is seen.
There is also distal reconstitution of the infrapopliteal arteries
with predominant runoff via the posterior tibial artery.

NON-VASCULAR

Chronic changes as described above.

## 2021-03-21 IMAGING — DX PORTABLE CHEST - 1 VIEW
1 series · 1 of 1 positions shown · non-contrast
Comparison: None.

CLINICAL DATA: Postop check.

EXAM:
PORTABLE CHEST 1 VIEW

[chest ap]
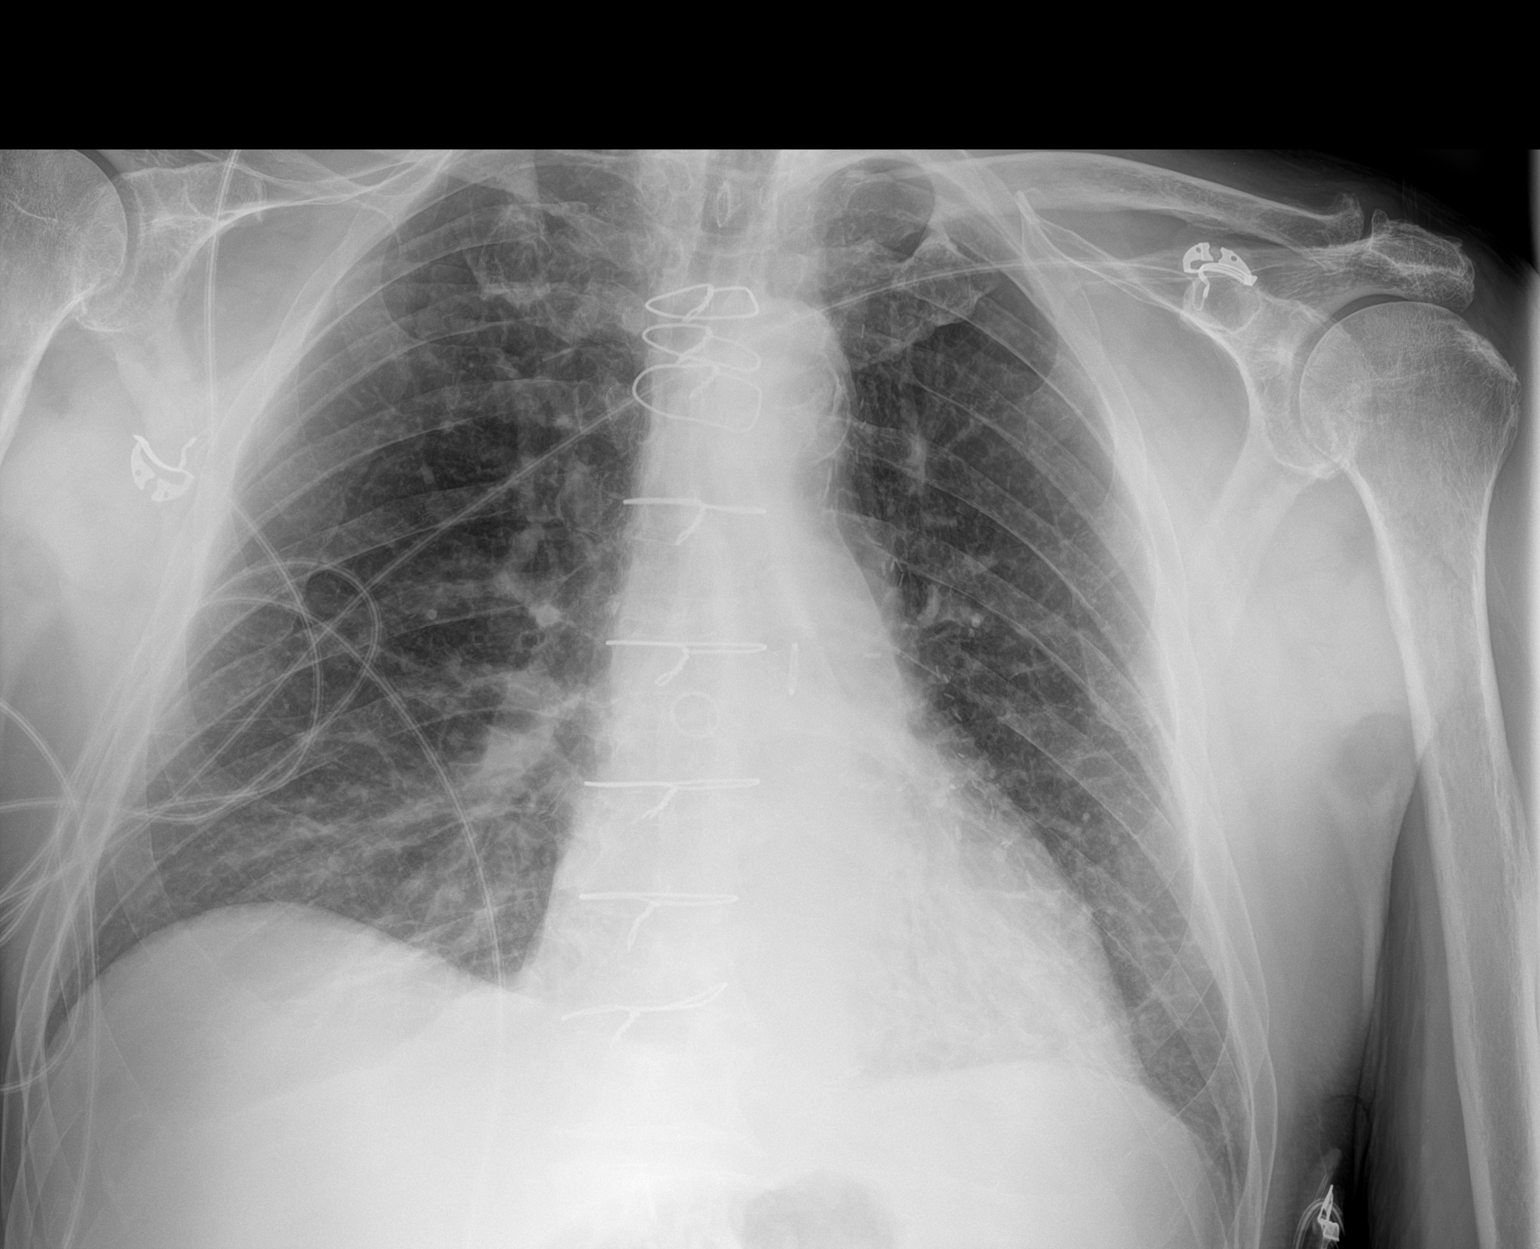

[1 of 1 positions shown; findings below may reference images not displayed]

FINDINGS: The heart size and mediastinal contours are within normal limits. No
pneumothorax or pleural effusion is noted. Minimal bibasilar
subsegmental atelectasis is noted. Status post coronary bypass
graft. The visualized skeletal structures are unremarkable.
IMPRESSION: Minimal bibasilar subsegmental atelectasis.

Aortic Atherosclerosis ([id number]).
